# Patient Record
Sex: Male | Born: 1955 | Hispanic: Yes | Marital: Married
Health system: Southern US, Community
[De-identification: ages and names within clinical notes are randomized; demographics above are authoritative.]

## PROBLEM LIST (undated history)

## (undated) DIAGNOSIS — E785 Hyperlipidemia, unspecified: Secondary | ICD-10-CM

## (undated) DIAGNOSIS — Z8673 Personal history of transient ischemic attack (TIA), and cerebral infarction without residual deficits: Secondary | ICD-10-CM

## (undated) DIAGNOSIS — I10 Essential (primary) hypertension: Secondary | ICD-10-CM

## (undated) HISTORY — DX: Hyperlipidemia, unspecified: E78.5

## (undated) HISTORY — DX: Personal history of transient ischemic attack (TIA), and cerebral infarction without residual deficits: Z86.73

## (undated) HISTORY — DX: Essential (primary) hypertension: I10

---

## 2022-01-12 ENCOUNTER — Emergency Department (HOSPITAL_COMMUNITY)
Admission: EM | Admit: 2022-01-12 | Discharge: 2022-01-13 | Disposition: A | Discharge status: Home / Self Care | Payer: Self-pay | Attending: Psychology | Admitting: Psychology

## 2022-01-12 ENCOUNTER — Emergency Department (HOSPITAL_COMMUNITY): Payer: Self-pay

## 2022-01-12 DIAGNOSIS — R4182 Altered mental status, unspecified: Secondary | ICD-10-CM | POA: Insufficient documentation

## 2022-01-12 DIAGNOSIS — R55 Syncope and collapse: Secondary | ICD-10-CM | POA: Insufficient documentation

## 2022-01-12 DIAGNOSIS — R0789 Other chest pain: Secondary | ICD-10-CM | POA: Insufficient documentation

## 2022-01-12 DIAGNOSIS — R1110 Vomiting, unspecified: Secondary | ICD-10-CM | POA: Insufficient documentation

## 2022-01-12 DIAGNOSIS — R1013 Epigastric pain: Secondary | ICD-10-CM | POA: Insufficient documentation

## 2022-01-12 LAB — CBC WITH DIFFERENTIAL/PLATELET
Abs Immature Granulocytes: 0.04 10*3/uL (ref 0.00–0.07)
Basophils Absolute: 0 10*3/uL (ref 0.0–0.1)
Basophils Relative: 0 %
Eosinophils Absolute: 0 10*3/uL (ref 0.0–0.5)
Eosinophils Relative: 0 %
HCT: 37.7 % — ABNORMAL LOW (ref 39.0–52.0)
Hemoglobin: 13 g/dL (ref 13.0–17.0)
Immature Granulocytes: 0 %
Lymphocytes Relative: 4 %
Lymphs Abs: 0.5 10*3/uL — ABNORMAL LOW (ref 0.7–4.0)
MCH: 33 pg (ref 26.0–34.0)
MCHC: 34.5 g/dL (ref 30.0–36.0)
MCV: 95.7 fL (ref 80.0–100.0)
Monocytes Absolute: 0.6 10*3/uL (ref 0.1–1.0)
Monocytes Relative: 5 %
Neutro Abs: 10.1 10*3/uL — ABNORMAL HIGH (ref 1.7–7.7)
Neutrophils Relative %: 91 %
Platelets: 230 10*3/uL (ref 150–400)
RBC: 3.94 MIL/uL — ABNORMAL LOW (ref 4.22–5.81)
RDW: 11.5 % (ref 11.5–15.5)
WBC: 11.2 10*3/uL — ABNORMAL HIGH (ref 4.0–10.5)
nRBC: 0 % (ref 0.0–0.2)

## 2022-01-12 LAB — COMPREHENSIVE METABOLIC PANEL
ALT: 59 U/L — ABNORMAL HIGH (ref 0–44)
AST: 44 U/L — ABNORMAL HIGH (ref 15–41)
Albumin: 3.7 g/dL (ref 3.5–5.0)
Alkaline Phosphatase: 78 U/L (ref 38–126)
Anion gap: 11 (ref 5–15)
BUN: 17 mg/dL (ref 8–23)
CO2: 26 mmol/L (ref 22–32)
Calcium: 8.7 mg/dL — ABNORMAL LOW (ref 8.9–10.3)
Chloride: 100 mmol/L (ref 98–111)
Creatinine, Ser: 1.19 mg/dL (ref 0.61–1.24)
GFR, Estimated: >60 mL/min (ref >60)
Glucose, Bld: 199 mg/dL — ABNORMAL HIGH (ref 70–99)
Potassium: 3.2 mmol/L — ABNORMAL LOW (ref 3.5–5.1)
Sodium: 137 mmol/L (ref 135–145)
Total Bilirubin: 1.2 mg/dL (ref 0.3–1.2)
Total Protein: 6.6 g/dL (ref 6.5–8.1)

## 2022-01-12 LAB — CBG MONITORING, ED: Glucose-Capillary: 201 mg/dL — ABNORMAL HIGH (ref 70–99)

## 2022-01-12 LAB — TROPONIN I (HIGH SENSITIVITY): Troponin I (High Sensitivity): 10 ng/L (ref <18)

## 2022-01-12 LAB — LIPASE, BLOOD: Lipase: 25 U/L (ref 11–51)

## 2022-01-12 MED ORDER — SODIUM CHLORIDE 0.9 % IV BOLUS
1000.0000 mL | Freq: Once | INTRAVENOUS | Status: AC
  Administered 2022-01-13: 1000 mL via INTRAVENOUS

## 2022-01-12 NOTE — ED Provider Notes (Signed)
[place] EMERGENCY DEPARTMENT Provider Note   CSN: [id number] Arrival date & time: 01/12/22  2149     History  Chief Complaint  Patient presents with   Loss of Consciousness    Cory Romero is a 66 y.o. male with a hx of hypertension and prior stroke who presents to the ED via EMS S/p syncope.   Per EMS to triage RN patient with memory deficits status post prior stroke therefore history is limited.  Per patient he states that he was not feeling well therefore he told his family to call 911.  He states that his stomach did hurt.  He is unable to tell me much else, denies pain in any other locations.  Level 5 caveat applies secondary to memory deficits.  Upon patient's wife's arrival to the emergency department I discussed with her for further history.  She reports that they are traveling here from [place] to visit their son here, they had been traveling all day yesterday and did not have much to eat or drink, they landed after midnight this morning.  Tonight the patient was sitting at the dinner table eating soup when he said he did not feel well and subsequently passed out with his head falling onto the dinner table.  He was not responsive and was diaphoretic.  He did have a bowel movement during this.  She does report that he had been complaining of some abdominal pain throughout the day today in the epigastric region, had a few loose Bms today and 1 episode of emesis.  She reports that he had a stroke in December 2022 and was treated for this in [facility], he is currently on 3 months of new medicines since his discharge and has been having memory deficits since then.  Interpreter utilized throughout Sales [employer].  HPI     Home Medications Prior to Admission medications   Not on File      Allergies    Patient has no allergy information on record.    Review of Systems   Review of Systems  Unable to perform ROS: Mental status change   Physical  Exam Updated Vital Signs BP 124/76 (BP Location: Left Arm)    Pulse (!) 109    Temp 98.4 F (36.9 C)    Resp (!) 23    SpO2 97%  Physical Exam Vitals and nursing note reviewed.  Constitutional:      General: He is not in acute distress.    Appearance: He is well-developed. He is not toxic-appearing.  HENT:     Head: Normocephalic and atraumatic.     Mouth/Throat:     Mouth: Mucous membranes are dry.  Eyes:     General:        Right eye: No discharge.        Left eye: No discharge.     Conjunctiva/sclera: Conjunctivae normal.  Cardiovascular:     Rate and Rhythm: Normal rate and regular rhythm.  Pulmonary:     Effort: Pulmonary effort is normal. No respiratory distress.     Breath sounds: Normal breath sounds. No wheezing, rhonchi or rales.  Abdominal:     Palpations: Abdomen is soft.     Tenderness: There is abdominal tenderness (Mild generalized, most prominent in the epigastric region.). There is no guarding or rebound.  Genitourinary:    Comments: Digital rectal exam performed with NT at bedside to chaperone, rectal tone intact.  Musculoskeletal:     Cervical back: Neck supple.  Right lower leg: No edema.     Left lower leg: No edema.  Skin:    General: Skin is warm and dry.     Findings: No rash.  Neurological:     Mental Status: He is alert.     Comments: No significant facial droop.  Sensation grossly intact to bilateral upper and lower extremities.  5-5 symmetric grip strength and strength with plantar dorsiflexion bilaterally.  Patient lift his lower extremities off of the bed without difficulty.  Negative pronator drift.  Patient is oriented to person, disoriented to place and time.  Psychiatric:        Behavior: Behavior normal.    ED Results / Procedures / Treatments   Labs (all labs ordered are listed, but only abnormal results are displayed) Labs Reviewed  COMPREHENSIVE METABOLIC PANEL - Abnormal; Notable for the following components:      Result Value    Potassium 3.2 (*)    Glucose, Bld 199 (*)    Calcium 8.7 (*)    AST 44 (*)    ALT 59 (*)    All other components within normal limits  CBC WITH DIFFERENTIAL/PLATELET - Abnormal; Notable for the following components:   WBC 11.2 (*)    RBC 3.94 (*)    HCT 37.7 (*)    Neutro Abs 10.1 (*)    Lymphs Abs 0.5 (*)    All other components within normal limits  URINALYSIS, ROUTINE W REFLEX MICROSCOPIC - Abnormal; Notable for the following components:   Color, Urine STRAW (*)    All other components within normal limits  ACETAMINOPHEN LEVEL - Abnormal; Notable for the following components:   Acetaminophen (Tylenol), Serum <10 (*)    All other components within normal limits  SALICYLATE LEVEL - Abnormal; Notable for the following components:   Salicylate Lvl [id number] (*)    All other components within normal limits  CBG MONITORING, ED - Abnormal; Notable for the following components:   Glucose-Capillary 201 (*)    All other components within normal limits  I-STAT VENOUS BLOOD GAS, ED - Abnormal; Notable for the following components:   pH, Ven 7.467 (*)    pCO2, Ven 39.8 (*)    pO2, Ven 55.0 (*)    Bicarbonate 28.8 (*)    Acid-Base Excess 5.0 (*)    Calcium, Ion 1.13 (*)    HCT 35.0 (*)    Hemoglobin 11.9 (*)    All other components within normal limits  LIPASE, BLOOD  ETHANOL  RAPID URINE DRUG SCREEN, HOSP PERFORMED  CK  AMMONIA  LACTIC ACID, PLASMA  BLOOD GAS, VENOUS  CBG MONITORING, ED  TROPONIN I (HIGH SENSITIVITY)  TROPONIN I (HIGH SENSITIVITY)    EKG EKG Interpretation  Date/Time:  Saturday January 12 2022 21:54:26 EST Ventricular Rate:  103 PR Interval:  133 QRS Duration: 100 QT Interval:  366 QTC Calculation: 480 R Axis:   132 Text Interpretation: Right and left arm electrode reversal, interpretation assumes no reversal Sinus tachycardia Atrial premature complexes Probable left atrial enlargement Consider left ventricular hypertrophy Abnormal T, consider ischemia,  diffuse leads TWI in AVL No old tracing to compare Confirmed by Wynona Dove ([id number]) on 01/12/2022 11:29:09 PM  Radiology CT Head Wo Contrast  Result Date: 01/13/2022 CLINICAL DATA:  Altered mental status. EXAM: CT HEAD WITHOUT CONTRAST TECHNIQUE: Contiguous axial images were obtained from the base of the skull through the vertex without intravenous contrast. RADIATION DOSE REDUCTION: This exam was performed according to the departmental dose-optimization  program which includes automated exposure control, adjustment of the mA and/or kV according to patient size and/or use of iterative reconstruction technique. COMPARISON:  None. FINDINGS: Brain: Mild age-related atrophy and moderate chronic microvascular ischemic changes. There is no acute intracranial hemorrhage. No mass effect midline shift. No extra-axial fluid collection. Vascular: No hyperdense vessel or unexpected calcification. Skull: Normal. Negative for fracture or focal lesion. Sinuses/Orbits: Diffuse mucoperiosteal thickening of paranasal sinuses. Bilateral maxillary sinus retention cyst or polyp. No air-fluid level. The mastoid air cells are clear. Other: None IMPRESSION: 1. No acute intracranial pathology. 2. Age-related atrophy and chronic microvascular ischemic changes. Electronically Signed   By: Anner Crete M.D.   On: 01/13/2022 01:45   DG Chest Portable 1 View  Result Date: 01/12/2022 CLINICAL DATA:  Epigastric pain EXAM: PORTABLE CHEST 1 VIEW COMPARISON:  None. FINDINGS: Artifact overlies the chest. There is a poor inspiration. The lungs are probably clear allowing for the poor inspiration. No evidence of heart failure or effusion. IMPRESSION: Poor inspiration.  No active disease suspected. Electronically Signed   By: Nelson Chimes M.D.   On: 01/12/2022 23:07   CT Angio Chest/Abd/Pel for Dissection W and/or W/WO  Result Date: 01/13/2022 CLINICAL DATA:  Chest pain or back pain, aortic dissection suspected. Altered mental status. EXAM:  CT ANGIOGRAPHY CHEST, ABDOMEN AND PELVIS TECHNIQUE: Non-contrast CT of the chest was initially obtained. Multidetector CT imaging through the chest, abdomen and pelvis was performed using the standard protocol during bolus administration of intravenous contrast. Multiplanar reconstructed images and MIPs were obtained and reviewed to evaluate the vascular anatomy. RADIATION DOSE REDUCTION: This exam was performed according to the departmental dose-optimization program which includes automated exposure control, adjustment of the mA and/or kV according to patient size and/or use of iterative reconstruction technique. CONTRAST:  167mL OMNIPAQUE IOHEXOL 350 MG/ML SOLN COMPARISON:  None. FINDINGS: CTA CHEST FINDINGS Cardiovascular: Preferential opacification of the thoracic aorta. No evidence of thoracic aortic aneurysm or dissection. No atherosclerotic plaque of the thoracic aorta. Left anterior descending coronary artery calcifications. Normal heart size. No significant pericardial effusion. The main pulmonary artery is normal in caliber. No central or segmental pulmonary embolus. Mediastinum/Nodes: No enlarged mediastinal, hilar, or axillary lymph nodes. Thyroid gland, trachea, and esophagus demonstrate no significant findings. Lungs/Pleura: Bilateral lower lobe subsegmental atelectasis. No focal consolidation. No pulmonary nodule. No pulmonary mass. No pleural effusion. No pneumothorax. Musculoskeletal: No chest wall abnormality. No suspicious lytic or blastic osseous lesions. No acute displaced fracture. Multilevel degenerative changes of the spine. Review of the MIP images confirms the above findings. CTA ABDOMEN AND PELVIS FINDINGS VASCULAR Aorta: Mild atherosclerotic plaque. Normal caliber aorta without aneurysm, dissection, vasculitis or significant stenosis. Celiac: Patent without evidence of aneurysm, dissection, vasculitis or significant stenosis. SMA: Patent without evidence of aneurysm, dissection,  vasculitis or significant stenosis. Renals: Both renal arteries are patent without evidence of aneurysm, dissection, vasculitis, fibromuscular dysplasia or significant stenosis. IMA: Patent without evidence of aneurysm, dissection, vasculitis or significant stenosis. Inflow: Mild atherosclerotic plaque. Patent without evidence of aneurysm, dissection, vasculitis or significant stenosis. Veins: No obvious venous abnormality within the limitations of this arterial phase study. Review of the MIP images confirms the above findings. NON-VASCULAR Hepatobiliary: No focal liver abnormality. No gallstones, gallbladder wall thickening, or pericholecystic fluid. No biliary dilatation. Pancreas: No focal lesion. Normal pancreatic contour. No surrounding inflammatory changes. No main pancreatic ductal dilatation. Spleen: Normal in size without focal abnormality. Adrenals/Urinary Tract: No adrenal nodule bilaterally. Bilateral kidneys enhance symmetrically. No hydronephrosis. No hydroureter.  The urinary bladder is unremarkable. Stomach/Bowel: Stomach is within normal limits. No evidence of bowel wall thickening or dilatation. Appendix appears normal. Lymphatic: No lymphadenopathy. Reproductive: Prostate is unremarkable. Other: No intraperitoneal free fluid. No intraperitoneal free gas. No organized fluid collection. Musculoskeletal: No abdominal wall hernia or abnormality. Mild subcutaneus soft tissue edema of the left it all soft tissue/hip with no large hematoma formation. No suspicious lytic or blastic osseous lesions. No acute displaced fracture. Multilevel degenerative changes of the spine. Review of the MIP images confirms the above findings. IMPRESSION: 1. No acute aortic abnormality. Aortic Atherosclerosis (ICD10-I70.0) including left anterior descending coronary artery calcification. 2. No pulmonary embolus. 3. No acute intrathoracic, intra-abdominal, intrapelvic abnormality. Electronically Signed   By: Iven Finn  M.D.   On: 01/13/2022 01:51    Procedures Procedures    Medications Ordered in ED Medications - No data to display  ED Course/ Medical Decision Making/ A&P                           Medical Decision Making Amount and/or Complexity of Data Reviewed Labs: ordered. Radiology: ordered.  Risk Prescription drug management.  Patient presents to the ED for evaluation of syncope, this involves an extensive number of treatment options, and is a complaint that carries with it a high risk of complications and morbidity. Nontoxic, vitals w/ mild tachycardia which improved on exam and while in the ED. Patient confused, no focal neuro deficits on exam. Some abdominal TTP present. MM dry.  Plan for broad work-up, will give fluids and Pepcid for symptomatic management.  Additional history obtained:  Additional history obtained from EMS, patient's wife.   EKG:  TWI in AVL, no STEMI, no prior to compare to   Cardiac Monitoring:  The patient was maintained on a cardiac monitor.  I personally viewed and interpreted the cardiac monitored which showed an underlying rhythm of: sinus rhythm.   Lab Tests:  I Ordered, reviewed, and interpreted labs, pertinent results include:  CBC: mild leukocytosis felt to be nonspecific.  CMP: mlidly elevated LFTs, hyperglycemia, and hypokalemia Lipase: WNL Etoh, acetaminophen, salicylate: Negative UDS: Negative Trops: Flat Lactic: WNL UA: no UTI Ammonia: Within normal limits UDS: Negative.   Imaging Studies ordered:  I ordered imaging studies which included CT head,  I independently reviewed & interpreted imaging & am in agreement with radiology impression which shows:  Chest x-ray: Poor inspiration.  No active disease suspected. CT head:1. No acute intracranial pathology. 2. Age-related atrophy and chronic microvascular ischemic changes.  CT angio chest/abdomen/pelvis dissection study:1. No acute aortic abnormality. Aortic Atherosclerosis (ICD10-I70.0)  including left anterior descending coronary artery calcification. 2. No pulmonary embolus. 3. No acute intrathoracic, intra-abdominal,   ED Course:  Patient presented to the emergency department status post syncopal episode, has had some decreased p.o. intake as well as some episodes of diarrhea today.  He has been given 1 L of normal saline, he is tolerating p.o., he is ambulatory in the emergency department.  His work-ups been overall reassuring, no critical electrolyte derangement, mild hypokalemia was orally replaced.  Mild elevated LFTs, will need PCP recheck.  Troponins are flat.  Lactic acid is within normal limits.  Toxicology unremarkable.  CT head as well as CT angio of the chest/abdomen/pelvis are fairly unremarkable for acute process.  Repeat abdominal exam remains without peritoneal signs, do not suspect surgical abdomen.  Cardiac monitoring fairly unremarkable.  Nursing staff has documented tachypneic respiratory rates at times,  however suspect this is monitor error as on multiple re-assessment he has not been tachypneic. Unclear definitive etiology of his syncope, suspect patient may have been dehydrated and had vasovagal episode with bowel movement during syncopal episode.  Given clinical improvement, his wife reports he is at baseline, and his ability to ambulate and tolerate p.o. feel he is reasonable for discharge.  Feel he would benefit from cardiology and primary care follow-up. I discussed results, treatment plan, need for follow-up, and return precautions with the patient and wife @ bedside- in agreement.   Findings and plan of care discussed with supervising physician Dr. Dr. Pearline Cables who has evaluated patient as shared visit & is in agreement.   Portions of this note were generated with Lobbyist. Dictation errors may occur despite best attempts at proofreading.   Final Clinical Impression(s) / ED Diagnoses Final diagnoses:  Syncope, unspecified syncope type    Rx  / DC Orders ED Discharge Orders     None         Amaryllis Dyke, PA-C 01/13/22 [employer], [place], DO 01/13/22 [phone]

## 2022-01-12 NOTE — ED Triage Notes (Signed)
Pt BIB [facility] from home for syncopal episode.  Pt and family are spanish speaking and require interpreter.  EMS used 911 interpreter to determine what they could.  Pt has been complaining of epigastric pain and had an episode of incontinence this afternoon.  Then this evening he had the syncopal episode with another episode of incontinence.  EMS stated his abdomen was tender to touch on palpation and that he seemed jaundiced to them.  PT had a stroke a few years ago and has memory issues as a result.  Family makes medical decisions now.   EMS Vitals:  130/82, HR 98, 02: 97 on 3L. EMS states he was 90-91% on RA and does not wear 02 at baseline that they could determine.

## 2022-01-13 ENCOUNTER — Emergency Department (HOSPITAL_COMMUNITY): Payer: Self-pay

## 2022-01-13 LAB — RAPID URINE DRUG SCREEN, HOSP PERFORMED
Amphetamines: NOT DETECTED
Barbiturates: NOT DETECTED
Benzodiazepines: NOT DETECTED
Cocaine: NOT DETECTED
Opiates: NOT DETECTED
Tetrahydrocannabinol: NOT DETECTED

## 2022-01-13 LAB — I-STAT VENOUS BLOOD GAS, ED
Acid-Base Excess: 5 mmol/L — ABNORMAL HIGH (ref 0.0–2.0)
Bicarbonate: 28.8 mmol/L — ABNORMAL HIGH (ref 20.0–28.0)
Calcium, Ion: 1.13 mmol/L — ABNORMAL LOW (ref 1.15–1.40)
HCT: 35 % — ABNORMAL LOW (ref 39.0–52.0)
Hemoglobin: 11.9 g/dL — ABNORMAL LOW (ref 13.0–17.0)
O2 Saturation: 90 %
Potassium: 3.6 mmol/L (ref 3.5–5.1)
Sodium: 139 mmol/L (ref 135–145)
TCO2: 30 mmol/L (ref 22–32)
pCO2, Ven: 39.8 mmHg — ABNORMAL LOW (ref 44.0–60.0)
pH, Ven: 7.467 — ABNORMAL HIGH (ref 7.250–7.430)
pO2, Ven: 55 mmHg — ABNORMAL HIGH (ref 32.0–45.0)

## 2022-01-13 LAB — URINALYSIS, ROUTINE W REFLEX MICROSCOPIC
Bilirubin Urine: NEGATIVE
Glucose, UA: NEGATIVE mg/dL
Hgb urine dipstick: NEGATIVE
Ketones, ur: NEGATIVE mg/dL
Leukocytes,Ua: NEGATIVE
Nitrite: NEGATIVE
Protein, ur: NEGATIVE mg/dL
Specific Gravity, Urine: 1.03 (ref 1.005–1.030)
pH: 7 (ref 5.0–8.0)

## 2022-01-13 LAB — LACTIC ACID, PLASMA: Lactic Acid, Venous: 1.7 mmol/L (ref 0.5–1.9)

## 2022-01-13 LAB — TROPONIN I (HIGH SENSITIVITY): Troponin I (High Sensitivity): 9 ng/L (ref <18)

## 2022-01-13 LAB — ETHANOL: Alcohol, Ethyl (B): <10 mg/dL (ref <10)

## 2022-01-13 LAB — CK: Total CK: 132 U/L (ref 49–397)

## 2022-01-13 LAB — ACETAMINOPHEN LEVEL: Acetaminophen (Tylenol), Serum: <10 ug/mL — ABNORMAL LOW (ref 10–30)

## 2022-01-13 LAB — AMMONIA: Ammonia: 25 umol/L (ref 9–35)

## 2022-01-13 LAB — SALICYLATE LEVEL: Salicylate Lvl: <7 mg/dL — ABNORMAL LOW (ref 7.0–30.0)

## 2022-01-13 MED ORDER — IOHEXOL 350 MG/ML SOLN
100.0000 mL | Freq: Once | INTRAVENOUS | Status: AC | PRN
  Administered 2022-01-13: 100 mL via INTRAVENOUS

## 2022-01-13 MED ORDER — FAMOTIDINE IN NACL 20-0.9 MG/50ML-% IV SOLN
20.0000 mg | Freq: Once | INTRAVENOUS | Status: AC
Start: 2022-01-13 — End: 2022-01-13
  Administered 2022-01-13: 20 mg via INTRAVENOUS
  Filled 2022-01-13: qty 50

## 2022-01-13 MED ORDER — POTASSIUM CHLORIDE CRYS ER 20 MEQ PO TBCR
40.0000 meq | EXTENDED_RELEASE_TABLET | Freq: Once | ORAL | Status: DC
Start: 2022-01-13 — End: 2022-01-13

## 2022-01-13 NOTE — ED Notes (Signed)
Pt  safely ambulated around room w/ no assistance and tolerated PO intake

## 2022-01-13 NOTE — Discharge Instructions (Signed)
You were seen in the ER today after passing out.   Your blood work was overall reassuring, it did show that your liver function tests are mildly elevated, your blood sugar was mildly elevated, your potassium was mildly low.  Please see attached guidelines to include potassium rich foods in your diet.  Please be sure to stay well-hydrated as we think this may be why you passed out.  Follow-up with primary care as well as cardiology soon as possible.  Return to the emergency department for new or worsening symptoms or any other concerns.  English to [place] Google Translate: Te vieron en urgencias hoy despus de desmayarte.  Su anlisis de [employer] tranquilizador en general, mostr que sus pruebas de funcin heptica estn ligeramente elevadas, su nivel de azcar en la sangre estaba ligeramente elevado, su potasio estaba ligeramente bajo. Consulte las pautas adjuntas para incluir alimentos ricos en potasio en su dieta. Asegrese de [employer] hidratado, ya que creemos que esta puede ser la razn por la que se [place]. Seguimiento con atencin primaria y cardiologa tan pronto como sea posible. Regrese al departamento de emergencias si hay sntomas nuevos o que empeoran o cualquier otra inquietud.

## 2023-01-19 IMAGING — DX DG CHEST 1V PORT
1 series · 1 of 1 positions shown · non-contrast
Comparison: None.

CLINICAL DATA: Epigastric pain

EXAM:
PORTABLE CHEST 1 VIEW

[chest ap]
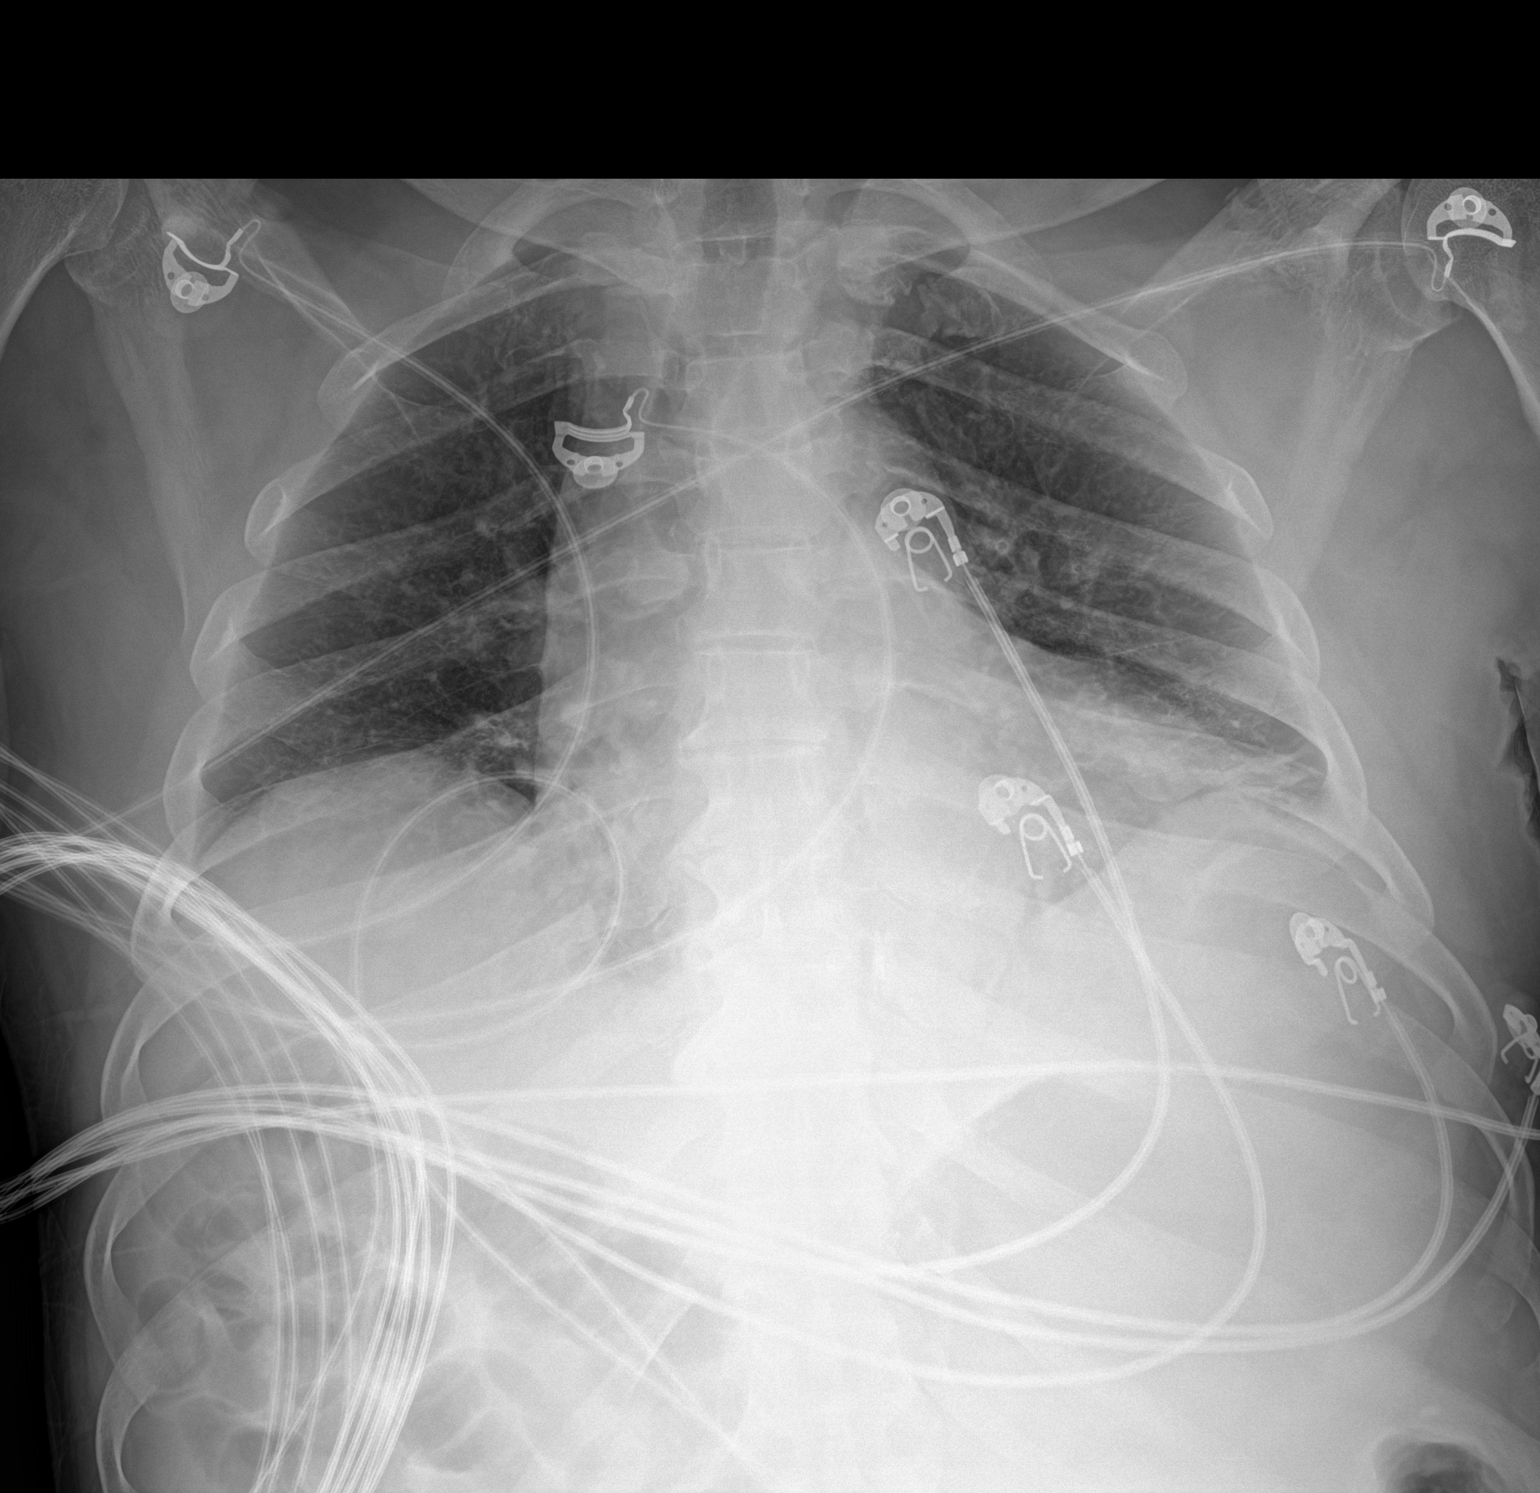

[1 of 1 positions shown; findings below may reference images not displayed]

FINDINGS: Artifact overlies the chest. There is a poor inspiration. The lungs
are probably clear allowing for the poor inspiration. No evidence of
heart failure or effusion.
IMPRESSION: Poor inspiration.  No active disease suspected.

## 2023-09-22 ENCOUNTER — Ambulatory Visit: Payer: 59 | Admitting: Medical

## 2023-09-24 ENCOUNTER — Ambulatory Visit (INDEPENDENT_AMBULATORY_CARE_PROVIDER_SITE_OTHER): Payer: 59 | Admitting: Medical

## 2023-09-24 ENCOUNTER — Encounter: Payer: Self-pay | Admitting: Medical

## 2023-09-24 ENCOUNTER — Other Ambulatory Visit (HOSPITAL_BASED_OUTPATIENT_CLINIC_OR_DEPARTMENT_OTHER): Payer: Self-pay

## 2023-09-24 VITALS — BP 152/90 | HR 75 | Temp 98.1°F | Resp 18 | Ht 66.0 in | Wt 150.6 lb

## 2023-09-24 DIAGNOSIS — I10 Essential (primary) hypertension: Secondary | ICD-10-CM

## 2023-09-24 DIAGNOSIS — R413 Other amnesia: Secondary | ICD-10-CM | POA: Diagnosis not present

## 2023-09-24 DIAGNOSIS — R4781 Slurred speech: Secondary | ICD-10-CM

## 2023-09-24 DIAGNOSIS — E785 Hyperlipidemia, unspecified: Secondary | ICD-10-CM | POA: Diagnosis not present

## 2023-09-24 DIAGNOSIS — R350 Frequency of micturition: Secondary | ICD-10-CM

## 2023-09-24 DIAGNOSIS — R972 Elevated prostate specific antigen [PSA]: Secondary | ICD-10-CM

## 2023-09-24 DIAGNOSIS — Z8673 Personal history of transient ischemic attack (TIA), and cerebral infarction without residual deficits: Secondary | ICD-10-CM | POA: Diagnosis not present

## 2023-09-24 LAB — CBC WITH DIFFERENTIAL/PLATELET
Basophils Absolute: 0.1 10*3/uL (ref 0.0–0.1)
Basophils Relative: 0.9 % (ref 0.0–3.0)
Eosinophils Absolute: 0.1 10*3/uL (ref 0.0–0.7)
Eosinophils Relative: 1.8 % (ref 0.0–5.0)
HCT: 42.8 % (ref 39.0–52.0)
Hemoglobin: 13.9 g/dL (ref 13.0–17.0)
Lymphocytes Relative: 20.9 % (ref 12.0–46.0)
Lymphs Abs: 1.3 10*3/uL (ref 0.7–4.0)
MCHC: 32.5 g/dL (ref 30.0–36.0)
MCV: 97.3 fL (ref 78.0–100.0)
Monocytes Absolute: 0.5 10*3/uL (ref 0.1–1.0)
Monocytes Relative: 8 % (ref 3.0–12.0)
Neutro Abs: 4.3 10*3/uL (ref 1.4–7.7)
Neutrophils Relative %: 68.4 % (ref 43.0–77.0)
Platelets: 281 10*3/uL (ref 150.0–400.0)
RBC: 4.4 Mil/uL (ref 4.22–5.81)
RDW: 12.3 % (ref 11.5–15.5)
WBC: 6.3 10*3/uL (ref 4.0–10.5)

## 2023-09-24 LAB — COMPREHENSIVE METABOLIC PANEL
ALT: 11 U/L (ref 0–53)
AST: 14 U/L (ref 0–37)
Albumin: 4.6 g/dL (ref 3.5–5.2)
Alkaline Phosphatase: 65 U/L (ref 39–117)
BUN: 13 mg/dL (ref 6–23)
CO2: 29 meq/L (ref 19–32)
Calcium: 9.9 mg/dL (ref 8.4–10.5)
Chloride: 101 meq/L (ref 96–112)
Creatinine, Ser: 1.03 mg/dL (ref 0.40–1.50)
GFR: 76.12 mL/min (ref >60.00)
Glucose, Bld: 111 mg/dL — ABNORMAL HIGH (ref 70–99)
Potassium: 4.4 meq/L (ref 3.5–5.1)
Sodium: 137 meq/L (ref 135–145)
Total Bilirubin: 1.4 mg/dL — ABNORMAL HIGH (ref 0.2–1.2)
Total Protein: 7.4 g/dL (ref 6.0–8.3)

## 2023-09-24 LAB — LIPID PANEL
Cholesterol: 219 mg/dL — ABNORMAL HIGH (ref 0–200)
HDL: 60.2 mg/dL (ref >39.00)
LDL Cholesterol: 140 mg/dL — ABNORMAL HIGH (ref 0–99)
NonHDL: 158.71
Total CHOL/HDL Ratio: 4
Triglycerides: 96 mg/dL (ref 0.0–149.0)
VLDL: 19.2 mg/dL (ref 0.0–40.0)

## 2023-09-24 LAB — POC URINALSYSI DIPSTICK (AUTOMATED)
Bilirubin, UA: NEGATIVE
Blood, UA: NEGATIVE
Glucose, UA: NEGATIVE
Ketones, UA: NEGATIVE
Leukocytes, UA: NEGATIVE
Nitrite, UA: NEGATIVE
Protein, UA: NEGATIVE
Spec Grav, UA: 1.01 (ref 1.010–1.025)
Urobilinogen, UA: 0.2 U/dL
pH, UA: 5 (ref 5.0–8.0)

## 2023-09-24 LAB — PSA: PSA: 5.79 ng/mL — ABNORMAL HIGH (ref 0.10–4.00)

## 2023-09-24 LAB — VITAMIN B12: Vitamin B-12: 289 pg/mL (ref 211–911)

## 2023-09-24 MED ORDER — LOSARTAN POTASSIUM 50 MG PO TABS
50.0000 mg | ORAL_TABLET | Freq: Every day | ORAL | 11 refills | Status: AC
  Filled 2023-09-24: qty 30, 30d supply, fill #0
  Filled 2023-11-17 (×2): qty 30, 30d supply, fill #1
  Filled 2023-12-11: qty 30, 30d supply, fill #2
  Filled 2024-01-19 – 2024-01-20 (×2): qty 30, 30d supply, fill #3
  Filled 2024-03-12: qty 30, 30d supply, fill #4
  Filled 2024-06-11: qty 30, 30d supply, fill #5
  Filled 2024-09-07: qty 30, 30d supply, fill #6

## 2023-09-24 MED ORDER — AMLODIPINE BESYLATE 5 MG PO TABS
5.0000 mg | ORAL_TABLET | Freq: Every day | ORAL | 11 refills | Status: AC
  Filled 2023-09-24: qty 30, 30d supply, fill #0
  Filled 2023-11-17: qty 30, 30d supply, fill #1
  Filled 2023-12-11: qty 30, 30d supply, fill #2
  Filled 2024-01-19 – 2024-01-20 (×2): qty 30, 30d supply, fill #3
  Filled 2024-03-12: qty 30, 30d supply, fill #4
  Filled 2024-06-11: qty 30, 30d supply, fill #5
  Filled 2024-09-07: qty 30, 30d supply, fill #6

## 2023-09-24 MED ORDER — ROSUVASTATIN CALCIUM 40 MG PO TABS
40.0000 mg | ORAL_TABLET | Freq: Every day | ORAL | 11 refills | Status: AC
  Filled 2023-09-24: qty 30, 30d supply, fill #0
  Filled 2023-11-17: qty 30, 30d supply, fill #1
  Filled 2023-12-11: qty 30, 30d supply, fill #2
  Filled 2024-01-19 – 2024-01-20 (×2): qty 30, 30d supply, fill #3
  Filled 2024-03-12: qty 30, 30d supply, fill #4
  Filled 2024-06-11: qty 30, 30d supply, fill #5
  Filled 2024-09-07: qty 30, 30d supply, fill #6

## 2023-09-24 NOTE — Patient Instructions (Signed)
1. Hypertension, unspecified type Losartan 50 mg daily and amlodipine 50 mg daily - Comp Met (CMET)  2. History of CVA in adulthood - Comp Met (CMET) - CBC w/Diff - CT HEAD WO CONTRAST ( ); Future  3. Hyperlipidemia, unspecified hyperlipidemia type Crestor 40 mg daily - Comp Met (CMET) - Lipid panel  4. Memory deficits Severe low mmse 7. Over past year difficulty speaking - B12 - CT HEAD WO CONTRAST ( ); Future  5. Frequent urination  - POCT Urinalysis Dipstick (Automated) - PSA  6. Slurred speech Recently worse but some poor speech that started in past. First time meeting pt and speech issue obvious. - CT HEAD WO CONTRAST ( ); Future  -need to get prior auth. If other symptoms/sign occure other than current speech issue then be seen in ED.  Follow up in 2 weeks or sooner if needed.

## 2023-09-24 NOTE — Progress Notes (Signed)
Subjective:    Patient ID: Cory Romero, male    DOB: Aug 22, 1956, 67 y.o.   MRN: [id number]  HPI Pt in for first time.  Pt is from [place].  Pt has history of CVA  December 2022 and carotid artery disease(dx in [place]). Pt has memory issues and told would eventually have dementia.  Wife state over past year he has lost ability to speak.   Htn- pt had been on medication but recently stopped taking/ran out of meds.  Post stroke in past was given candasartan 16 mg and amlodpine 5 mg daily. Plavix 75 mg daily and rosuvastatin 40 mg daily.   Per wife told to ue plavix 75 mg daily for 3 months has not used for 2 years.   Pt had syncope 01-12-2022. Work up in ED   Imaging Studies ordered:  I ordered imaging studies which included CT head,  I independently reviewed & interpreted imaging & am in agreement with radiology impression which shows:  Chest x-ray: Poor inspiration.  No active disease suspected. CT head:1. No acute intracranial pathology. 2. Age-related atrophy and chronic microvascular ischemic changes.  CT angio chest/abdomen/pelvis dissection study:1. No acute aortic abnormality. Aortic Atherosclerosis (ICD10-I70.0) including left anterior descending coronary artery calcification. 2. No pulmonary embolus. 3. No acute intrathoracic, intra-abdominal,   Over past 2 years memory worsening and has hard time expresing himself.     Review of Systems  Respiratory:  Negative for cough, chest tightness, wheezing and stridor.   Cardiovascular:  Negative for chest pain and palpitations.  Gastrointestinal:  Negative for abdominal pain, constipation, nausea and vomiting.  Genitourinary:  Positive for frequency. Negative for dysuria, penile swelling and urgency.       For 2 years urinating frequently day and night.  Musculoskeletal:  Negative for gait problem.  Skin:  Negative for rash.  Neurological:  Negative for dizziness and light-headedness.  Hematological:  Negative for  adenopathy. Does not bruise/bleed easily.  Psychiatric/Behavioral:  Negative for behavioral problems.        Over 2 years severe memory changes per wife. Slurred speach    Past Medical History:  Diagnosis Date   History of stroke    Hyperlipidemia    Hypertension      Social History   Socioeconomic History   Marital status: Married    Spouse name: Not on file   Number of children: Not on file   Years of education: Not on file   Highest education level: Not on file  Occupational History   Occupation: work in hotel maintenance  Tobacco Use   Smoking status: Former    Current packs/day: 0.00    Average packs/day: 1.5 packs/day for 41.0 years (61.5 ttl pk-yrs)    Types: Cigarettes    Start date: 20    Quit date: 2018    Years since quitting: 6.8   Smokeless tobacco: Never  Vaping Use   Vaping status: Never Used  Substance and Sexual Activity   Alcohol use: Not Currently    Comment: none for 6 years   Drug use: Never   Sexual activity: Not Currently  Other Topics Concern   Not on file  Social History Narrative   Not on file   Social Determinants of Health   Financial Resource Strain: Not on file  Food Insecurity: Not on file  Transportation Needs: Not on file  Physical Activity: Not on file  Stress: Not on file  Social Connections: Not on file  Intimate Partner Violence: Not  on file    History reviewed. No pertinent surgical history.  History reviewed. No pertinent family history.  Not on File  No current outpatient medications on file prior to visit.   No current facility-administered medications on file prior to visit.    BP (!) 152/90   Pulse 75   Temp 98.1 F (36.7 C) (Oral)   Resp 18   Ht 5\' 6"  (1.676 m)   Wt 150 lb 9.6 oz (68.3 kg)   SpO2 95%   BMI 24.31 kg/m        Objective:   Physical Exam  General Mental Status- Alert. General Appearance- Not in acute distress.   Skin General: Color- Normal Color. Moisture- Normal  Moisture.  Neck Carotid Arteries- Normal color. Moisture- Normal Moisture. No carotid bruits. No JVD.  Chest and Lung Exam Auscultation: Breath Sounds:-Normal.  Cardiovascular Auscultation:Rythm- Regular. Murmurs & Other Heart Sounds:Auscultation of the heart reveals- No Murmurs.  Abdomen Inspection:-Inspeection Normal. Palpation/Percussion:Note:No mass. Palpation and Percussion of the abdomen reveal- Non Tender, Non Distended + BS, no rebound or guarding.    Neurologic Cranial Nerve exam:- CN III-XII intact(No nystagmus), symmetric smile. Drift Test:- No drift. Romberg Exam:- Negative.  Heal to Toe Gait exam:-Normal. Finger to Nose:- Normal/Intact Strength:- 5/5 equal and symmetric strength both upper and lower extremities.       Assessment & Plan:   Patient Instructions  1. Hypertension, unspecified type Losartan 50 mg daily and amlodipine 50 mg daily - Comp Met (CMET)  2. History of CVA in adulthood - Comp Met (CMET) - CBC w/Diff - CT HEAD WO CONTRAST ( ); Future  3. Hyperlipidemia, unspecified hyperlipidemia type Crestor 40 mg daily - Comp Met (CMET) - Lipid panel  4. Memory deficits Severe low mmse 7. Over past year difficulty speaking - B12 - CT HEAD WO CONTRAST ( ); Future  5. Frequent urination  - POCT Urinalysis Dipstick (Automated) - PSA  6. Slurred speech Recently worse but some poor speech that started in past. First time meeting pt and speech issue obvious. - CT HEAD WO CONTRAST ( ); Future  -need to get prior auth. If other symptoms/sign occure other than current speech issue then be seen in ED.  Follow up in 2 weeks or sooner if needed.   [name], PA-C  Time spent with patient today was 61  minutes new pt with complicated history and no records) performed mmse today, chart review, discussing diagnosis, work up treatment and documentation. Placed referral neurologist. Answered question of wife.

## 2023-09-25 NOTE — Addendum Note (Signed)
Addended by: Gwenevere Abbot on: 09/25/2023 05:37 PM   Modules accepted: Orders

## 2023-09-29 ENCOUNTER — Telehealth: Payer: Self-pay | Admitting: Medical

## 2023-09-29 NOTE — Telephone Encounter (Signed)
Called patient back several times but no answer

## 2023-09-29 NOTE — Telephone Encounter (Signed)
Patient spouse called and would like a call back for lab results. Patient Is spanish speaking. Please call [phone]

## 2023-09-30 NOTE — Telephone Encounter (Signed)
Called again and still no answer. Results mailed out to patient

## 2023-10-08 ENCOUNTER — Ambulatory Visit: Payer: Self-pay | Admitting: Medical

## 2023-10-15 ENCOUNTER — Other Ambulatory Visit: Payer: 59

## 2023-10-20 ENCOUNTER — Ambulatory Visit: Payer: 59 | Admitting: Medical

## 2023-10-29 ENCOUNTER — Ambulatory Visit
Admission: RE | Admit: 2023-10-29 | Discharge: 2023-10-29 | Disposition: A | Discharge status: Home / Self Care | Payer: 59 | Source: Ambulatory Visit | Attending: Medical | Admitting: Medical

## 2023-10-29 ENCOUNTER — Ambulatory Visit: Payer: 59 | Admitting: Urology

## 2023-10-29 DIAGNOSIS — R4182 Altered mental status, unspecified: Secondary | ICD-10-CM | POA: Diagnosis not present

## 2023-10-29 DIAGNOSIS — Z8673 Personal history of transient ischemic attack (TIA), and cerebral infarction without residual deficits: Secondary | ICD-10-CM

## 2023-10-29 DIAGNOSIS — R4781 Slurred speech: Secondary | ICD-10-CM

## 2023-10-29 DIAGNOSIS — R413 Other amnesia: Secondary | ICD-10-CM

## 2023-10-29 DIAGNOSIS — G319 Degenerative disease of nervous system, unspecified: Secondary | ICD-10-CM | POA: Diagnosis not present

## 2023-10-29 DIAGNOSIS — I6389 Other cerebral infarction: Secondary | ICD-10-CM | POA: Diagnosis not present

## 2023-10-29 NOTE — Progress Notes (Deleted)
   Assessment: 1. Elevated PSA     Plan: I personally reviewed the patient's chart including provider notes, and lab results. Today I had a long discussion with the patient regarding PSA and the rationale and controversies of prostate cancer early detection.  I discussed the pros and cons of further evaluation including TRUS and prostate Bx.  Potential adverse events and complications as well as standard instructions were given.  Patient expressed his understanding of these issues.    Chief Complaint: No chief complaint on file.   History of Present Illness:  Cory Romero is a 67 y.o. male who is seen in consultation from Cory Romero, [place] for evaluation of elevated PSA. PSA 10/24: 5.79   Past Medical History:  Past Medical History:  Diagnosis Date   History of stroke    Hyperlipidemia    Hypertension     Past Surgical History:  No past surgical history on file.  Allergies:  Not on File  Family History:  No family history on file.  Social History:  Social History   Tobacco Use   Smoking status: Former    Current packs/day: 0.00    Average packs/day: 1.5 packs/day for 41.0 years (61.5 ttl pk-yrs)    Types: Cigarettes    Start date: 71    Quit date: 2018    Years since quitting: 6.9   Smokeless tobacco: Never  Vaping Use   Vaping status: Never Used  Substance Use Topics   Alcohol use: Not Currently    Comment: none for 6 years   Drug use: Never    Review of symptoms:  Constitutional:  Negative for unexplained weight loss, night sweats, fever, chills ENT:  Negative for nose bleeds, sinus pain, painful swallowing CV:  Negative for chest pain, shortness of breath, exercise intolerance, palpitations, loss of consciousness Resp:  Negative for cough, wheezing, shortness of breath GI:  Negative for nausea, vomiting, diarrhea, bloody stools GU:  Positives noted in HPI; otherwise negative for gross hematuria, dysuria, urinary incontinence Neuro:   Negative for seizures, poor balance, limb weakness, slurred speech Psych:  Negative for lack of energy, depression, anxiety Endocrine:  Negative for polydipsia, polyuria, symptoms of hypoglycemia (dizziness, hunger, sweating) Hematologic:  Negative for anemia, purpura, petechia, prolonged or excessive bleeding, use of anticoagulants  Allergic:  Negative for difficulty breathing or choking as a result of exposure to anything; no shellfish allergy; no allergic response (rash/itch) to materials, foods  Physical exam: There were no vitals taken for this visit. GENERAL APPEARANCE:  Well appearing, well developed, well nourished, NAD HEENT: Atraumatic, Normocephalic, oropharynx clear. NECK: Supple without lymphadenopathy or thyromegaly. LUNGS: Clear to auscultation bilaterally. HEART: Regular Rate and Rhythm without murmurs, gallops, or rubs. ABDOMEN: Soft, non-tender, No Masses. EXTREMITIES: Moves all extremities well.  Without clubbing, cyanosis, or edema. NEUROLOGIC:  Alert and oriented x 3, normal gait, CN II-XII grossly intact.  MENTAL STATUS:  Appropriate. BACK:  Non-tender to palpation.  No CVAT SKIN:  Warm, dry and intact.   GU: Penis:  {Exam; penis:5791} Meatus: {Meatus:15530} Scrotum: {pe scrotum:310183} Testis: {Exam; testicles:5790} Epididymis: {epididymis ZDGU:440347} Prostate: {Exam; prostate:5793} Rectum: {rectal exam:26517}   Results: U/A:

## 2023-10-30 ENCOUNTER — Telehealth: Payer: Self-pay | Admitting: Medical

## 2023-10-30 NOTE — Telephone Encounter (Signed)
Gwenn,  I placed referral about a month ago. On review neurologist scheduling note stated "Once notes are done, send back urgent referral". Note was done same day as referral. Just got back CT head report. Will you reach out to neurologist office and ask if they can expidite his referral(if possibe?).   Thanks, [name], PA-C

## 2023-11-05 ENCOUNTER — Other Ambulatory Visit: Payer: 59

## 2023-11-12 ENCOUNTER — Encounter (HOSPITAL_BASED_OUTPATIENT_CLINIC_OR_DEPARTMENT_OTHER): Payer: Self-pay | Admitting: Urology

## 2023-11-12 ENCOUNTER — Emergency Department (HOSPITAL_BASED_OUTPATIENT_CLINIC_OR_DEPARTMENT_OTHER): Payer: 59

## 2023-11-12 ENCOUNTER — Observation Stay (HOSPITAL_BASED_OUTPATIENT_CLINIC_OR_DEPARTMENT_OTHER)
Admission: EM | Admit: 2023-11-12 | Discharge: 2023-11-14 | Disposition: A | Discharge status: Home / Self Care | Payer: 59 | Attending: Emergency Medicine | Admitting: Emergency Medicine

## 2023-11-12 ENCOUNTER — Other Ambulatory Visit: Payer: Self-pay

## 2023-11-12 DIAGNOSIS — I422 Other hypertrophic cardiomyopathy: Secondary | ICD-10-CM | POA: Insufficient documentation

## 2023-11-12 DIAGNOSIS — R739 Hyperglycemia, unspecified: Secondary | ICD-10-CM | POA: Diagnosis not present

## 2023-11-12 DIAGNOSIS — I6782 Cerebral ischemia: Secondary | ICD-10-CM | POA: Diagnosis not present

## 2023-11-12 DIAGNOSIS — Z7902 Long term (current) use of antithrombotics/antiplatelets: Secondary | ICD-10-CM | POA: Insufficient documentation

## 2023-11-12 DIAGNOSIS — I63532 Cerebral infarction due to unspecified occlusion or stenosis of left posterior cerebral artery: Secondary | ICD-10-CM | POA: Diagnosis not present

## 2023-11-12 DIAGNOSIS — Z87891 Personal history of nicotine dependence: Secondary | ICD-10-CM | POA: Diagnosis not present

## 2023-11-12 DIAGNOSIS — R4189 Other symptoms and signs involving cognitive functions and awareness: Secondary | ICD-10-CM | POA: Insufficient documentation

## 2023-11-12 DIAGNOSIS — Z79899 Other long term (current) drug therapy: Secondary | ICD-10-CM | POA: Insufficient documentation

## 2023-11-12 DIAGNOSIS — D649 Anemia, unspecified: Secondary | ICD-10-CM | POA: Insufficient documentation

## 2023-11-12 DIAGNOSIS — G9389 Other specified disorders of brain: Secondary | ICD-10-CM | POA: Diagnosis not present

## 2023-11-12 DIAGNOSIS — R29818 Other symptoms and signs involving the nervous system: Secondary | ICD-10-CM | POA: Diagnosis not present

## 2023-11-12 DIAGNOSIS — I639 Cerebral infarction, unspecified: Principal | ICD-10-CM | POA: Diagnosis present

## 2023-11-12 DIAGNOSIS — R519 Headache, unspecified: Secondary | ICD-10-CM | POA: Diagnosis not present

## 2023-11-12 DIAGNOSIS — Z7982 Long term (current) use of aspirin: Secondary | ICD-10-CM | POA: Insufficient documentation

## 2023-11-12 DIAGNOSIS — I10 Essential (primary) hypertension: Secondary | ICD-10-CM | POA: Diagnosis not present

## 2023-11-12 DIAGNOSIS — R410 Disorientation, unspecified: Secondary | ICD-10-CM | POA: Diagnosis not present

## 2023-11-12 DIAGNOSIS — E785 Hyperlipidemia, unspecified: Secondary | ICD-10-CM | POA: Diagnosis not present

## 2023-11-12 LAB — URINALYSIS, ROUTINE W REFLEX MICROSCOPIC
Bilirubin Urine: NEGATIVE
Glucose, UA: NEGATIVE mg/dL
Hgb urine dipstick: NEGATIVE
Ketones, ur: NEGATIVE mg/dL
Leukocytes,Ua: NEGATIVE
Nitrite: NEGATIVE
Protein, ur: NEGATIVE mg/dL
Specific Gravity, Urine: 1.01 (ref 1.005–1.030)
pH: 6 (ref 5.0–8.0)

## 2023-11-12 LAB — COMPREHENSIVE METABOLIC PANEL
ALT: 16 U/L (ref 0–44)
AST: 16 U/L (ref 15–41)
Albumin: 4 g/dL (ref 3.5–5.0)
Alkaline Phosphatase: 65 U/L (ref 38–126)
Anion gap: 8 (ref 5–15)
BUN: 18 mg/dL (ref 8–23)
CO2: 24 mmol/L (ref 22–32)
Calcium: 8.9 mg/dL (ref 8.9–10.3)
Chloride: 102 mmol/L (ref 98–111)
Creatinine, Ser: 0.95 mg/dL (ref 0.61–1.24)
GFR, Estimated: >60 mL/min (ref >60)
Glucose, Bld: 224 mg/dL — ABNORMAL HIGH (ref 70–99)
Potassium: 3.6 mmol/L (ref 3.5–5.1)
Sodium: 134 mmol/L — ABNORMAL LOW (ref 135–145)
Total Bilirubin: 0.7 mg/dL (ref <1.2)
Total Protein: 7.2 g/dL (ref 6.5–8.1)

## 2023-11-12 LAB — DIFFERENTIAL
Abs Immature Granulocytes: 0.03 10*3/uL (ref 0.00–0.07)
Basophils Absolute: 0.1 10*3/uL (ref 0.0–0.1)
Basophils Relative: 1 %
Eosinophils Absolute: 0.1 10*3/uL (ref 0.0–0.5)
Eosinophils Relative: 1 %
Immature Granulocytes: 0 %
Lymphocytes Relative: 15 %
Lymphs Abs: 1.2 10*3/uL (ref 0.7–4.0)
Monocytes Absolute: 0.5 10*3/uL (ref 0.1–1.0)
Monocytes Relative: 6 %
Neutro Abs: 6.3 10*3/uL (ref 1.7–7.7)
Neutrophils Relative %: 77 %

## 2023-11-12 LAB — CBC
HCT: 37.7 % — ABNORMAL LOW (ref 39.0–52.0)
Hemoglobin: 12.8 g/dL — ABNORMAL LOW (ref 13.0–17.0)
MCH: 31.9 pg (ref 26.0–34.0)
MCHC: 34 g/dL (ref 30.0–36.0)
MCV: 94 fL (ref 80.0–100.0)
Platelets: 260 10*3/uL (ref 150–400)
RBC: 4.01 MIL/uL — ABNORMAL LOW (ref 4.22–5.81)
RDW: 11.2 % — ABNORMAL LOW (ref 11.5–15.5)
WBC: 8.2 10*3/uL (ref 4.0–10.5)
nRBC: 0 % (ref 0.0–0.2)

## 2023-11-12 LAB — RAPID URINE DRUG SCREEN, HOSP PERFORMED
Amphetamines: NOT DETECTED
Barbiturates: NOT DETECTED
Benzodiazepines: NOT DETECTED
Cocaine: NOT DETECTED
Opiates: NOT DETECTED
Tetrahydrocannabinol: NOT DETECTED

## 2023-11-12 LAB — ETHANOL: Alcohol, Ethyl (B): <10 mg/dL (ref <10)

## 2023-11-12 LAB — APTT: aPTT: 27 s (ref 24–36)

## 2023-11-12 LAB — PROTIME-INR
INR: 1 (ref 0.8–1.2)
Prothrombin Time: 13.4 s (ref 11.4–15.2)

## 2023-11-12 MED ORDER — PROCHLORPERAZINE EDISYLATE 10 MG/2ML IJ SOLN
10.0000 mg | Freq: Once | INTRAMUSCULAR | Status: AC
  Administered 2023-11-12: 10 mg via INTRAVENOUS
  Filled 2023-11-12: qty 2

## 2023-11-12 MED ORDER — DIPHENHYDRAMINE HCL 50 MG/ML IJ SOLN
12.5000 mg | Freq: Once | INTRAMUSCULAR | Status: AC
  Administered 2023-11-12: 12.5 mg via INTRAVENOUS
  Filled 2023-11-12: qty 1

## 2023-11-12 NOTE — ED Provider Notes (Signed)
[facility] Provider Note   CSN: [id number] Arrival date & time: 11/12/23  1419     History  Chief Complaint  Patient presents with   Headache   Gait Problem    Cory Romero is a 67 y.o. male with a past medical history significant for hypertension, hyperlipidemia, and history of CVA who presents to the ED due to intermittent headaches for the past week.  Headaches have worsened in intensity and frequency over the past 3 days.  Wife at bedside states the patient has been grabbing his head frequently throughout the day complaining of a headache. Headache located on top portion of his head. Patient has some cognitive disability and speech changes since his CVA roughly 2 years ago.  Patient was seen by PCP and had a CT head on 11/27 which was negative for any acute abnormalities.  Did demonstrate some chronic infarcts. At that time, he was referred to neurology.  Wife also notes patient's been dragging his right leg which is atypical for patient.  No recent head injury.  Not on any blood thinners.  No vision changes.  No changes to speech from his baseline following CVA 2 years ago. Patient denies any unilateral weakness; however family friend at bedside notes that his cognitive ability is limit and it is hard for him to understand questions?? No recent fever. No neck stiffness. Wife notes patient has required  History obtained from patient and past medical records. Official interpreter used during entire encounter     Home Medications Prior to Admission medications   Medication Sig Start Date End Date Taking? Authorizing Provider  amLODipine (NORVASC) 5 MG tablet Take 1 tablet (5 mg total) by mouth daily. 09/24/23   Saguier, Ramon Dredge, PA-C  losartan (COZAAR) 50 MG tablet Take 1 tablet (50 mg total) by mouth daily. 09/24/23   Saguier, Ramon Dredge, PA-C  rosuvastatin (CRESTOR) 40 MG tablet Take 1 tablet (40 mg total) by mouth daily. 09/24/23    Saguier, Ramon Dredge, PA-C      Allergies    Patient has no known allergies.    Review of Systems   Review of Systems  Constitutional:  Negative for fever.  Eyes:  Negative for visual disturbance.  Neurological:  Positive for weakness and headaches.    Physical Exam Updated Vital Signs BP (!) 174/93   Pulse (!) 102   Temp 98 F (36.7 C) (Oral)   Resp (!) 24   Ht 5\' 6"  (1.676 m)   Wt 68.3 kg   SpO2 98%   BMI 24.30 kg/m  Physical Exam Vitals and nursing note reviewed.  Constitutional:      General: He is not in acute distress.    Appearance: He is not ill-appearing.  HENT:     Head: Normocephalic.  Eyes:     Pupils: Pupils are equal, round, and reactive to light.  Cardiovascular:     Rate and Rhythm: Normal rate and regular rhythm.     Pulses: Normal pulses.     Heart sounds: Normal heart sounds. No murmur heard.    No friction rub. No gallop.  Pulmonary:     Effort: Pulmonary effort is normal.     Breath sounds: Normal breath sounds.  Abdominal:     General: Abdomen is flat. There is no distension.     Palpations: Abdomen is soft.     Tenderness: There is no abdominal tenderness. There is no guarding or rebound.  Musculoskeletal:  General: Normal range of motion.     Cervical back: Neck supple.  Skin:    General: Skin is warm and dry.  Neurological:     General: No focal deficit present.     Mental Status: He is alert.     Comments: Speech is clear, able to follow commands CN III-XII intact Normal strength in upper and lower extremities bilaterally including dorsiflexion and plantar flexion, strong and equal grip strength Sensation grossly intact throughout Moves extremities without ataxia, coordination intact No pronator drift   Psychiatric:        Mood and Affect: Mood normal.        Behavior: Behavior normal.     ED Results / Procedures / Treatments   Labs (all labs ordered are listed, but only abnormal results are displayed) Labs Reviewed   CBC - Abnormal; Notable for the following components:      Result Value   RBC 4.01 (*)    Hemoglobin 12.8 (*)    HCT 37.7 (*)    RDW 11.2 (*)    All other components within normal limits  COMPREHENSIVE METABOLIC PANEL - Abnormal; Notable for the following components:   Sodium 134 (*)    Glucose, Bld 224 (*)    All other components within normal limits  ETHANOL  PROTIME-INR  APTT  DIFFERENTIAL  RAPID URINE DRUG SCREEN, HOSP PERFORMED  URINALYSIS, ROUTINE W REFLEX MICROSCOPIC    EKG None  Radiology MR BRAIN WO CONTRAST  Result Date: 11/12/2023 CLINICAL DATA:  Neuro deficit with acute stroke suspected. Irritated with difficult gait EXAM: MRI HEAD WITHOUT CONTRAST TECHNIQUE: Multiplanar, multiecho pulse sequences of the brain and surrounding structures were obtained without intravenous contrast. COMPARISON:  Head CT from earlier today FINDINGS: Brain: Small acute infarct in the posterior left pons, at the floor of the fourth ventricle. There is a background of severe chronic small vessel ischemia with extensive gliosis in the cerebral white matter, deep gray nuclei, and pons. Multiple chronic infarcts in the pons and deep gray nuclei/deep white matter. Innumerable microhemorrhages primarily in the deep brain and correlating with a hypertensive encephalopathy pattern. No acute hemorrhage, hydrocephalus, or masslike finding. Vascular: Major flow voids are preserved Skull and upper cervical spine: Normal marrow signal Sinuses/Orbits: Negative IMPRESSION: 1. Punctate acute infarct in the posterior left pons along the floor of the fourth ventricle. 2. Severe chronic small vessel ischemia. Electronically Signed   By: Tiburcio Pea M.D.   On: 11/12/2023 18:47   CT HEAD WO CONTRAST  Result Date: 11/12/2023 CLINICAL DATA:  Provided history: Neuro deficit, acute, stroke suspected. Additional history provided: headache, confusion. EXAM: CT HEAD WITHOUT CONTRAST TECHNIQUE: Contiguous axial images  were obtained from the base of the skull through the vertex without intravenous contrast. RADIATION DOSE REDUCTION: This exam was performed according to the departmental dose-optimization program which includes automated exposure control, adjustment of the mA and/or kV according to patient size and/or use of iterative reconstruction technique. COMPARISON:  Head CT 10/29/2023. FINDINGS: Brain: Generalized cerebral atrophy. Chronic lacunar infarcts again noted within the left subinsular white matter and central pons. Advanced patchy and ill-defined hypoattenuation elsewhere within the cerebral white matter and bilateral thalami, nonspecific but likely reflecting chronic small vessel ischemic disease given the patient's history of hypertension, hyperlipidemia and stroke. There is no acute intracranial hemorrhage. No demarcated cortical infarct. No extra-axial fluid collection. No evidence of an intracranial mass. No midline shift. Vascular: No hyperdense vessel.  Atherosclerotic calcifications. Skull: No calvarial fracture or aggressive  osseous lesion. Sinuses/Orbits: No mass or acute finding within the imaged orbits. Mild mucosal thickening within the right frontal, bilateral ethmoid and right sphenoid sinuses. IMPRESSION: 1.  No evidence of an acute intracranial abnormality. 2. Chronic lacunar infarcts again noted within the left subinsular white matter and central pons. 3. Advanced patchy and ill-defined hypoattenuation elsewhere within the cerebral white matter and thalami, nonspecific but likely reflecting chronic small vessel ischemic disease (given the patient's history of hypertension, hyperlipidemia and stroke). 4. Generalized cerebral atrophy. 5. Paranasal sinus disease at the imaged levels, as described. Electronically Signed   By: Jackey Loge D.O.   On: 11/12/2023 15:49    Procedures .Critical Care  Performed by: Mannie Stabile, PA-C Authorized by: Mannie Stabile, PA-C   Critical care  provider statement:    Critical care time (minutes):  31   Critical care was necessary to treat or prevent imminent or life-threatening deterioration of the following conditions:  CNS failure or compromise   Critical care was time spent personally by me on the following activities:  Development of treatment plan with patient or surrogate, discussions with consultants, evaluation of patient's response to treatment, examination of patient, ordering and review of laboratory studies, ordering and review of radiographic studies, ordering and performing treatments and interventions, pulse oximetry, re-evaluation of patient's condition and review of old charts   I assumed direction of critical care for this patient from another provider in my specialty: no     Care discussed with: admitting provider       Medications Ordered in ED Medications  prochlorperazine (COMPAZINE) injection 10 mg (10 mg Intravenous Given 11/12/23 1514)  diphenhydrAMINE (BENADRYL) injection 12.5 mg (12.5 mg Intravenous Given 11/12/23 1515)    ED Course/ Medical Decision Making/ A&P Clinical Course as of 11/12/23 2003  Wed Nov 12, 2023  1904 Reassessed patient at bedside, headache resolved. MRI demonstrates acute CVA. Will discuss with neurology. [CA]    Clinical Course User Index [CA] Mannie Stabile, PA-C                                 Medical Decision Making Amount and/or Complexity of Data Reviewed Independent Historian: spouse External Data Reviewed: notes. Labs: ordered. Decision-making details documented in ED Course. Radiology: ordered and independent interpretation performed. Decision-making details documented in ED Course. ECG/medicine tests: ordered and independent interpretation performed. Decision-making details documented in ED Course.  Risk Prescription drug management. Decision regarding hospitalization.   This patient presents to the ED for concern of headache, gait difficulties, this  involves an extensive number of treatment options, and is a complaint that carries with it a high risk of complications and morbidity.  The differential diagnosis includes CVA, metabolic derangement, infection, etc  67 year old male presents to the ED due to worsening headaches that been present for the past week however, worsened over the past 3 days.  Had a CT head on 11/27 which demonstrated old infarcts however, no acute abnormalities.  Had a CVA 2 years ago.  Recently started dragging his right leg.  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  Wife and family friend at bedside.  Upon arrival patient afebrile, not tachycardic or hypoxic.  Patient in no acute distress.  No meningismus on exam to suggest meningitis.  Neurological exam difficult to perform however, appears nonfocal.  CT head ordered.  Stroke labs ordered.  Patient will likely need MRI given new leg  dragging. Migraine cocktail given. Out of TNK window.  CBC reassuring.  No leukocytosis.  Hemoglobin at 12.8.  CMP with hyperglycemia 224.  No anion gap.  Low suspicion for DKA.  Normal renal function.  UA negative for signs of infection.  CT head personally reviewed and interpreted which is negative for any acute abnormalities.  Does demonstrate chronic infarcts.  MRI demonstrates acute infarct.  7:47 PM Discussed with Dr. Wilford Corner with neurology who will have neurology see patient upon transfer [facility].   8:10 PM Discussed with Dr. Antionette Char with [facility] who agrees to admit patient   Co morbidities that complicate the patient evaluation  HTN, HLD Cardiac Monitoring: / EKG:  The patient was maintained on a cardiac monitor.  I personally viewed and interpreted the cardiac monitored which showed an underlying rhythm of: NSR  Social Determinants of Health:  Language barrier- interpreter used during entire encounter  Test / Admission - Considered:  Patient will require admission for CVA        Final Clinical  Impression(s) / ED Diagnoses Final diagnoses:  Acute CVA (cerebrovascular accident) [facility])    Rx / DC Orders ED Discharge Orders     None         Mannie Stabile, PA-C 11/12/23 2011    Tanda Rockers A, DO 11/15/23 [phone]

## 2023-11-12 NOTE — Progress Notes (Signed)
Plan of Care Note for accepted transfer   Patient: Cory Romero MRN: [id number]   DOA: 11/12/2023  Facility requesting transfer: [facility]   Requesting Provider: Dr. Audelia Acton  Reason for transfer: Stroke workup   Facility course: 67 yr old man with hx of hypertension and CVA with residual speech deficit who presents with headache and dragging his right leg since 11/09/2023.  MRI brain reveals acute left pontine infarct.  Neurology (Dr. Wilford Corner) recommended admission for CVA workup.  Plan of care: The patient is accepted for admission to Telemetry unit, at [facility].   Author: Briscoe Deutscher, MD 11/12/2023  Check [email or web address] for on-call coverage.  Nursing staff, Please call [facility] Admits & Consults System-Wide number on Amion as soon as patient's arrival, so appropriate admitting provider can evaluate the pt.

## 2023-11-12 NOTE — ED Triage Notes (Signed)
Per family headaches since Sunday, grabs head and get irritated, states also having difficulty with gait since Sunday  Pcp told to come to ER   Pt had stroke approx 2 years ago, with verbal deficits

## 2023-11-13 DIAGNOSIS — I10 Essential (primary) hypertension: Secondary | ICD-10-CM | POA: Insufficient documentation

## 2023-11-13 DIAGNOSIS — E785 Hyperlipidemia, unspecified: Secondary | ICD-10-CM | POA: Insufficient documentation

## 2023-11-13 DIAGNOSIS — I639 Cerebral infarction, unspecified: Secondary | ICD-10-CM | POA: Diagnosis not present

## 2023-11-13 LAB — CBC
HCT: 41.5 % (ref 39.0–52.0)
Hemoglobin: 14.1 g/dL (ref 13.0–17.0)
MCH: 32 pg (ref 26.0–34.0)
MCHC: 34 g/dL (ref 30.0–36.0)
MCV: 94.3 fL (ref 80.0–100.0)
Platelets: 290 10*3/uL (ref 150–400)
RBC: 4.4 MIL/uL (ref 4.22–5.81)
RDW: 11.2 % — ABNORMAL LOW (ref 11.5–15.5)
WBC: 8.4 10*3/uL (ref 4.0–10.5)
nRBC: 0 % (ref 0.0–0.2)

## 2023-11-13 LAB — CREATININE, SERUM
Creatinine, Ser: 1.11 mg/dL (ref 0.61–1.24)
GFR, Estimated: >60 mL/min (ref >60)

## 2023-11-13 LAB — HIV ANTIBODY (ROUTINE TESTING W REFLEX): HIV Screen 4th Generation wRfx: NONREACTIVE

## 2023-11-13 LAB — HEMOGLOBIN A1C
Hgb A1c MFr Bld: 5.2 % (ref 4.8–5.6)
Mean Plasma Glucose: 102.54 mg/dL

## 2023-11-13 MED ORDER — CLOPIDOGREL BISULFATE 75 MG PO TABS
300.0000 mg | ORAL_TABLET | Freq: Once | ORAL | Status: AC
  Administered 2023-11-13: 300 mg via ORAL
  Filled 2023-11-13: qty 4

## 2023-11-13 MED ORDER — ROSUVASTATIN CALCIUM 20 MG PO TABS
40.0000 mg | ORAL_TABLET | Freq: Every day | ORAL | Status: DC
  Administered 2023-11-13 – 2023-11-14 (×2): 40 mg via ORAL
  Filled 2023-11-13 (×2): qty 2

## 2023-11-13 MED ORDER — ENOXAPARIN SODIUM 40 MG/0.4ML IJ SOSY
40.0000 mg | PREFILLED_SYRINGE | INTRAMUSCULAR | Status: DC
  Administered 2023-11-13: 40 mg via SUBCUTANEOUS
  Filled 2023-11-13: qty 0.4

## 2023-11-13 MED ORDER — ZOLPIDEM TARTRATE 5 MG PO TABS
10.0000 mg | ORAL_TABLET | Freq: Every evening | ORAL | Status: DC | PRN
  Administered 2023-11-13: 10 mg via ORAL
  Filled 2023-11-13: qty 2

## 2023-11-13 MED ORDER — SENNOSIDES-DOCUSATE SODIUM 8.6-50 MG PO TABS: 1.0000 | ORAL_TABLET | Freq: Every evening | ORAL | Status: DC | PRN

## 2023-11-13 MED ORDER — SODIUM CHLORIDE 0.9 % IV SOLN: INTRAVENOUS | Status: DC

## 2023-11-13 MED ORDER — ACETAMINOPHEN 160 MG/5ML PO SOLN: 650.0000 mg | ORAL | Status: DC | PRN

## 2023-11-13 MED ORDER — STROKE: EARLY STAGES OF RECOVERY BOOK
Freq: Once | Status: AC
  Filled 2023-11-13: qty 1

## 2023-11-13 MED ORDER — CLOPIDOGREL BISULFATE 75 MG PO TABS: 75.0000 mg | ORAL_TABLET | Freq: Every day | ORAL | Status: DC

## 2023-11-13 MED ORDER — LOSARTAN POTASSIUM 50 MG PO TABS
50.0000 mg | ORAL_TABLET | Freq: Every day | ORAL | Status: DC
  Administered 2023-11-13 – 2023-11-14 (×2): 50 mg via ORAL
  Filled 2023-11-13 (×2): qty 1

## 2023-11-13 MED ORDER — ACETAMINOPHEN 325 MG PO TABS
650.0000 mg | ORAL_TABLET | ORAL | Status: DC | PRN
Start: 2023-11-13 — End: 2023-11-14

## 2023-11-13 MED ORDER — ACETAMINOPHEN 650 MG RE SUPP
650.0000 mg | RECTAL | Status: DC | PRN
Start: 2023-11-13 — End: 2023-11-14

## 2023-11-13 MED ORDER — AMLODIPINE BESYLATE 5 MG PO TABS
5.0000 mg | ORAL_TABLET | Freq: Every day | ORAL | Status: DC
  Administered 2023-11-13 – 2023-11-14 (×2): 5 mg via ORAL
  Filled 2023-11-13 (×2): qty 1

## 2023-11-13 NOTE — Plan of Care (Signed)

## 2023-11-13 NOTE — ED Notes (Signed)
Spoken to [facility] currently their are no beds at the moment. No ETA of transport either patient still waiting for a bed. Received call at 02:41 am

## 2023-11-13 NOTE — Consult Note (Addendum)
NEUROLOGY CONSULT NOTE   Date of service: November 13, 2023 Patient Name: Cory Romero MRN:  [id number] DOB:  08/25/1956 Chief Complaint: "subacute L pontine CVA" Requesting Provider: Hughie Closs, MD  History of Present Illness  Cory Romero is a 67 y.o. 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  History obtained with use of interpreter, information obtained largely from his wife.   Patient has baseline dysarthria since CVA 11/2021 however he was noted to have dragging of his R foot on Sunday afternoon. He also had intermittent headaches and his dysarthria is worse as well, in addition to seeming more confused. Patient says that his R foot weakness is improved from when he presented for evaluation yesterday.  Since his stroke in 2022 his wife has noticed progression of retrograde memory loss and that he needs more assistance around the home due to not only imbalance when moving but because he is easily lost (for example he cannot find the bathroom even if it is right in front of him). In that time he has also exhibited more of a child-like demeanor.   Does not use tobacco, alcohol, or other substances.  LKW: 12/08 afternoon Modified rankin score: 3-Moderate disability-requires help but walks WITHOUT assistance IV Thrombolysis: No (out of window) EVT: No   NIHSS components Score: Comment  1a Level of Conscious 0[] 1[] 2[] 3[]     1b LOC Questions 0[] 1[] 2[]      1c LOC Commands 0[] 1[] 2[]      2 Best Gaze 0[] 1[] 2[]      3 Visual 0[] 1[] 2[] 3[]     4 Facial Palsy 0[] 1[] 2[] 3[]     5a Motor Arm - left 0[] 1[] 2[] 3[] 4[] UN[]   5b Motor Arm - Right 0[] 1[] 2[] 3[] 4[] UN[]   6a Motor Leg - Left 0[] 1[] 2[] 3[] 4[] UN[]   6b Motor Leg - Right 0[] 1[] 2[] 3[] 4[] UN[]   7 Limb Ataxia 0[] 1[] 2[] 3[] UN[]    8 Sensory 0[] 1[] 2[] UN[]     9 Best Language 0[] 1[] 2[]  3[]     10 Dysarthria 0[] 1[] 2[] UN[]     11  Extinct. and Inattention 0[]  1[]  2[]       TOTAL:       ROS  Comprehensive ROS performed and pertinent positives documented in HPI   Past History   Past Medical History: Past Medical History:  Diagnosis Date   History of stroke    Hyperlipidemia    Hypertension    Past Surgical History: History reviewed. No pertinent surgical history.  Family History: History reviewed. No pertinent family history.  Social History  reports that he quit smoking about 6 years ago. His smoking use included cigarettes. He started smoking about 47 years ago. He has a 61.5 pack-year smoking history. He has never used smokeless tobacco. He reports that he does not currently use alcohol. He reports that he does not use drugs.  Allergies: Allergies  Allergen Reactions   Aspirin Swelling and Other (See Comments)    Facial swelling    Medications   Current Facility-Administered Medications:    [START ON 11/14/2023]  stroke: early stages of recovery book, , Does not apply, Once, Cory Closs, MD   acetaminophen (TYLENOL) tablet 650 mg, 650 mg, Oral, Q4H PRN **OR** acetaminophen (TYLENOL)  160 MG/5ML solution 650 mg, 650 mg, Per Tube, Q4H PRN **OR** acetaminophen (TYLENOL) suppository 650 mg, 650 mg, Rectal, Q4H PRN, Romero, Ravi, MD   amLODipine (NORVASC) tablet 5 mg, 5 mg, Oral, Daily, Romero, Ravi, MD   clopidogrel (PLAVIX) tablet 300 mg, 300 mg, Oral, Once, Romero, Ravi, MD   enoxaparin (LOVENOX) injection 40 mg, 40 mg, Subcutaneous, Q24H, Romero, Ravi, MD   losartan (COZAAR) tablet 50 mg, 50 mg, Oral, Daily, Romero, Ravi, MD   rosuvastatin (CRESTOR) tablet 40 mg, 40 mg, Oral, Daily, Romero, Ravi, MD   senna-docusate (Senokot-S) tablet 1 tablet, 1 tablet, Oral, QHS PRN, Cory Closs, MD  Vitals   Vitals:   11/13/23 0930 11/13/23 1103 11/13/23 1234 11/13/23 1330  BP: (!) 143/88  (!) 140/93 (!) 163/92  Pulse: (!) 104  97 84  Resp: (!) 22  (!)  23 (!) 22  Temp:  97.6 F (36.4 C) 98 F (36.7 C)   TempSrc:  Oral    SpO2: 95%  96% 92%  Weight:      Height:        Body mass index is 24.3 kg/m.  Physical Exam   Constitutional: Appears older than stated age, in no acute distress. Psych: Affect appropriate to situation.  Eyes: No scleral injection.  HENT: No OP obstruction.  Head: Normocephalic.  Cardiovascular: Tachycardic. Respiratory: Effort normal, non-labored breathing.  Skin: WDI.  No increased warmth, erythema, or edema between LE.  Neurologic Examination   Mental Status: Patient is awake, alert, oriented to self. No signs of aphasia or neglect. Mild dysarthria.  Pronator drift on the right. Cranial Nerves: II: Pupils equal, round, and reactive to light.   III,IV, VI: EOMI without ptosis or diploplia.  V: Facial sensation is symmetric to light touch and temperature. VII: L facial droop. VIII: Hearing is intact to voice XI: Shoulder shrug is symmetric.  Motor: Normal effort thorughout, at least 5/5 bilateral UE, 5/5 bilateral LE. Sensory: Sensation is grossly intact and equal bilateral UE & LE. Cerebellar: Finger-Nose abnormal on L.  Labs/Imaging/Neurodiagnostic studies   CBC:  Recent Labs  Lab 11-25-23 1504  WBC 8.2  NEUTROABS 6.3  HGB 12.8*  HCT 37.7*  MCV 94.0  PLT 260   Basic Metabolic Panel:  Lab Results  Component Value Date   NA 134 (L) 11/25/23   K 3.6 2023/11/25   CO2 24 11-25-2023   GLUCOSE 224 (H) 11/25/23   BUN 18 2023-11-25   CREATININE 0.95 November 25, 2023   CALCIUM 8.9 11/25/23   GFRNONAA >60 2023/11/25   Lipid Panel:  Lab Results  Component Value Date   LDLCALC 140 (H) 09/24/2023   HgbA1c: No results found for: "HGBA1C" Urine Drug Screen:     Component Value Date/Time   LABOPIA NONE DETECTED 25-Nov-2023 1921   COCAINSCRNUR NONE DETECTED 2023-11-25 1921   LABBENZ NONE DETECTED 11/25/23 1921   AMPHETMU NONE DETECTED 11-25-2023 1921   THCU NONE DETECTED 11/25/23  1921   LABBARB NONE DETECTED Nov 25, 2023 1921    Alcohol Level     Component Value Date/Time   ETH <10 25-Nov-2023 1504   INR  Lab Results  Component Value Date   INR 1.0 2023-11-25   APTT  Lab Results  Component Value Date   APTT 27 11-25-23   AED levels: No results found for: "PHENYTOIN", "ZONISAMIDE", "LAMOTRIGINE", "LEVETIRACETA"  CT Head without contrast: 1.  No evidence of an acute intracranial abnormality. 2. Chronic lacunar infarcts again noted within the left subinsular white matter  and central pons. 3. Advanced patchy and ill-defined hypoattenuation elsewhere within the cerebral white matter and thalami, nonspecific but likely reflecting chronic small vessel ischemic disease (given the patient's history of hypertension, hyperlipidemia and stroke). 4. Generalized cerebral atrophy. 5. Paranasal sinus disease at the imaged levels, as described.  MRI Brain: 1. Punctate acute infarct in the posterior left pons along the floor of the fourth ventricle. 2. Severe chronic small vessel ischemia.  ASSESSMENT   Mete Stahlberg is a 67 y.o. 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  RECOMMENDATIONS   -Risk factor modification: Lipid panel, goal LDL <70, HbA1c -Echocardiogram -CTA head and neck -Admit to telemetry -Frequent neuro checks -Plavix 300 mg x1, then 75 mg daily  -Allergic to ASA -Goal normotension -PT, OT, SLP -Neurology will continue to follow ______________________________________________________________________  Signed, Cory Mungo, DO Internal Medicine PGY-3  I have seen the patient reviewed the above note.  Given his wife's description of "childlike" behavior since his previous stroke, I strongly suspect this patient has some underlying dementia, likely vascular.    He has had an acute pontine stroke, likely small vessel in etiology and is undergoing workup  for secondary risk factor modification.  Ritta Slot, MD [facility] [phone]  If 7pm- 7am, please page neurology on call as listed in AMION.

## 2023-11-13 NOTE — Progress Notes (Signed)
   11/13/23 1559  TOC Brief Assessment  Insurance and Status Reviewed Administrator [employer] Q[employer])  Patient has primary care physician Yes (Saguier, Ramon Dredge, PA-C)  Home environment has been reviewed From home with spouse  Prior level of function: independent; some cognitive disability and speech changes since his CVA approx 2 years ago.  Prior/Current Home Services No current home services (No History in [place])  Social Drivers of Health Review SDOH reviewed no interventions necessary  Readmission risk has been reviewed Yes (N/A Listed)  Transition of care needs no transition of care needs at this time   Patient here for CVA workup. From home with Spouse  No TOC needs identified at this time but please place a consult if a TOC need does arise

## 2023-11-13 NOTE — Plan of Care (Signed)

## 2023-11-13 NOTE — ED Notes (Signed)
Called 5 west to give report and RN busy at time so she will call me back Ricard Dillon)

## 2023-11-13 NOTE — H&P (Addendum)
History and Physical    [place] ZOX:[id number] DOB: 1956/03/16 DOA: 11/12/2023  PCP: [name], PA-C  Patient coming from: Home[facility]   I have personally briefly reviewed patient's old medical records in [facility]  Chief Complaint: Right foot weakness  HPI: Cory Romero is a 67 y.o. male with medical history significant of previous CVA leaving him with some dysarthria which happened in December 2022 [place], hypertension and hyperlipidemia presented to med [employer] [facility] on 11/12/2023 with 3-day history of dragging of the right foot which started on Sunday, 11/09/2023.  Patient is from [place], has been in the [place] for 2 years, only speaks [place].  Son and wife at the bedside.  In person Spanish interpreter used for obtaining history.  Patient started dragging his right foot on Sunday as mentioned above.  He was also complaining of intermittent headache and was grabbing his head.  Was brought to the ED yesterday.  Was diagnosed with left pontine acute ischemic stroke.  Per wife, he was slightly delirious yesterday.  Upon further questioning, she tells me that patient does not have any formal diagnosis of dementia however lately for last 1 to 2 years, they have noticed that patient is having some short-term memory issues.  Currently patient has no complaint.  His weakness in the right foot has improved compared to yesterday.  Patient is alert, oriented to self and can also tell his birthday but he thinks that he is still in the medical [place] and could not tell current month or the year.  ED Course: Patient was slightly hypertensive and tachycardic upon arrival to ED yesterday.  Mild hyponatremia.  Hyperglycemic.  Mild anemia.  MRI confirmed acute ischemic stroke.  Neurology consulted, they recommended admission for further stroke workup.  Patient did not receive any medications in the ED.  Review of Systems: As per HPI  otherwise negative.    Past Medical History:  Diagnosis Date   History of stroke    Hyperlipidemia    Hypertension     History reviewed. No pertinent surgical history.   reports that he quit smoking about 6 years ago. His smoking use included cigarettes. He started smoking about 47 years ago. He has a 61.5 pack-year smoking history. He has never used smokeless tobacco. He reports that he does not currently use alcohol. He reports that he does not use drugs.  Allergies  Allergen Reactions   Aspirin Swelling and Other (See Comments)    Facial swelling    History reviewed. No pertinent family history.  Prior to Admission medications   Medication Sig Start Date End Date Taking? Authorizing Provider  amLODipine (NORVASC) 5 MG tablet Take 1 tablet (5 mg total) by mouth daily. 09/24/23   Saguier, Ramon Dredge, PA-C  losartan (COZAAR) 50 MG tablet Take 1 tablet (50 mg total) by mouth daily. 09/24/23   Saguier, Ramon Dredge, PA-C  rosuvastatin (CRESTOR) 40 MG tablet Take 1 tablet (40 mg total) by mouth daily. 09/24/23   [name], PA-C    Physical Exam: Vitals:   11/13/23 0930 11/13/23 1103 11/13/23 1234 11/13/23 1330  BP: (!) 143/88  (!) 140/93 (!) 163/92  Pulse: (!) 104  97 84  Resp: (!) 22  (!) 23 (!) 22  Temp:  97.6 F (36.4 C) 98 F (36.7 C)   TempSrc:  Oral    SpO2: 95%  96% 92%  Weight:      Height:        Constitutional: NAD,  calm, comfortable Vitals:   11/13/23 0930 11/13/23 1103 11/13/23 1234 11/13/23 1330  BP: (!) 143/88  (!) 140/93 (!) 163/92  Pulse: (!) 104  97 84  Resp: (!) 22  (!) 23 (!) 22  Temp:  97.6 F (36.4 C) 98 F (36.7 C)   TempSrc:  Oral    SpO2: 95%  96% 92%  Weight:      Height:       Eyes: PERRL, lids and conjunctivae normal ENMT: Mucous membranes are moist. Posterior pharynx clear of any exudate or lesions.Normal dentition.  Neck: normal, supple, no masses, no thyromegaly Respiratory: clear to auscultation bilaterally, no wheezing, no  crackles. Normal respiratory effort. No accessory muscle use.  Cardiovascular: Regular rate and rhythm, no murmurs / rubs / gallops. No extremity edema. 2+ pedal pulses. No carotid bruits.  Abdomen: no tenderness, no masses palpated. No hepatosplenomegaly. Bowel sounds positive.  Musculoskeletal: no clubbing / cyanosis. No joint deformity upper and lower extremities. Good ROM, no contractures. Normal muscle tone.  Skin: no rashes, lesions, ulcers. No induration Neurologic: CN 2-12 grossly intact. Sensation intact, DTR normal. Strength 5/5 in all 4.  Has some dysarthria which is chronic and has some right facial droop. Psychiatric: Alert and oriented x 1. Normal mood.    Labs on Admission: I have personally reviewed following labs and imaging studies  CBC: Recent Labs  Lab 11/12/23 1504  WBC 8.2  NEUTROABS 6.3  HGB 12.8*  HCT 37.7*  MCV 94.0  PLT 260   Basic Metabolic Panel: Recent Labs  Lab 11/12/23 1504  NA 134*  K 3.6  CL 102  CO2 24  GLUCOSE 224*  BUN 18  CREATININE 0.95  CALCIUM 8.9   GFR: Estimated Creatinine Clearance: 70 mL/min (by C-G formula based on SCr of 0.95 mg/dL). Liver Function Tests: Recent Labs  Lab 11/12/23 1504  AST 16  ALT 16  ALKPHOS 65  BILITOT 0.7  PROT 7.2  ALBUMIN 4.0   No results for input(s): "LIPASE", "AMYLASE" in the last 168 hours. No results for input(s): "AMMONIA" in the last 168 hours. Coagulation Profile: Recent Labs  Lab 11/12/23 1504  INR 1.0   Cardiac Enzymes: No results for input(s): "CKTOTAL", "CKMB", "CKMBINDEX", "TROPONINI" in the last 168 hours. BNP (last 3 results) No results for input(s): "PROBNP" in the last 8760 hours. HbA1C: No results for input(s): "HGBA1C" in the last 72 hours. CBG: No results for input(s): "GLUCAP" in the last 168 hours. Lipid Profile: No results for input(s): "CHOL", "HDL", "LDLCALC", "TRIG", "CHOLHDL", "LDLDIRECT" in the last 72 hours. Thyroid Function Tests: No results for  input(s): "TSH", "T4TOTAL", "FREET4", "T3FREE", "THYROIDAB" in the last 72 hours. Anemia Panel: No results for input(s): "VITAMINB12", "FOLATE", "FERRITIN", "TIBC", "IRON", "RETICCTPCT" in the last 72 hours. Urine analysis:    Component Value Date/Time   COLORURINE YELLOW 11/12/2023 1921   APPEARANCEUR CLEAR 11/12/2023 1921   LABSPEC 1.010 11/12/2023 1921   PHURINE 6.0 11/12/2023 1921   GLUCOSEU NEGATIVE 11/12/2023 1921   HGBUR NEGATIVE 11/12/2023 1921   BILIRUBINUR NEGATIVE 11/12/2023 1921   BILIRUBINUR negative 09/24/2023 1149   KETONESUR NEGATIVE 11/12/2023 1921   PROTEINUR NEGATIVE 11/12/2023 1921   UROBILINOGEN 0.2 09/24/2023 1149   NITRITE NEGATIVE 11/12/2023 1921   LEUKOCYTESUR NEGATIVE 11/12/2023 1921    Radiological Exams on Admission: MR BRAIN WO CONTRAST Result Date: 11/12/2023 CLINICAL DATA:  Neuro deficit with acute stroke suspected. Irritated with difficult gait EXAM: MRI HEAD WITHOUT CONTRAST TECHNIQUE: Multiplanar, multiecho pulse sequences of  the brain and surrounding structures were obtained without intravenous contrast. COMPARISON:  Head CT from earlier today FINDINGS: Brain: Small acute infarct in the posterior left pons, at the floor of the fourth ventricle. There is a background of severe chronic small vessel ischemia with extensive gliosis in the cerebral white matter, deep gray nuclei, and pons. Multiple chronic infarcts in the pons and deep gray nuclei/deep white matter. Innumerable microhemorrhages primarily in the deep brain and correlating with a hypertensive encephalopathy pattern. No acute hemorrhage, hydrocephalus, or masslike finding. Vascular: Major flow voids are preserved Skull and upper cervical spine: Normal marrow signal Sinuses/Orbits: Negative IMPRESSION: 1. Punctate acute infarct in the posterior left pons along the floor of the fourth ventricle. 2. Severe chronic small vessel ischemia. Electronically Signed   By: Tiburcio Pea M.D.   On: 11/12/2023  18:47   CT HEAD WO CONTRAST Result Date: 11/12/2023 CLINICAL DATA:  Provided history: Neuro deficit, acute, stroke suspected. Additional history provided: headache, confusion. EXAM: CT HEAD WITHOUT CONTRAST TECHNIQUE: Contiguous axial images were obtained from the base of the skull through the vertex without intravenous contrast. RADIATION DOSE REDUCTION: This exam was performed according to the departmental dose-optimization program which includes automated exposure control, adjustment of the mA and/or kV according to patient size and/or use of iterative reconstruction technique. COMPARISON:  Head CT 10/29/2023. FINDINGS: Brain: Generalized cerebral atrophy. Chronic lacunar infarcts again noted within the left subinsular white matter and central pons. Advanced patchy and ill-defined hypoattenuation elsewhere within the cerebral white matter and bilateral thalami, nonspecific but likely reflecting chronic small vessel ischemic disease given the patient's history of hypertension, hyperlipidemia and stroke. There is no acute intracranial hemorrhage. No demarcated cortical infarct. No extra-axial fluid collection. No evidence of an intracranial mass. No midline shift. Vascular: No hyperdense vessel.  Atherosclerotic calcifications. Skull: No calvarial fracture or aggressive osseous lesion. Sinuses/Orbits: No mass or acute finding within the imaged orbits. Mild mucosal thickening within the right frontal, bilateral ethmoid and right sphenoid sinuses. IMPRESSION: 1.  No evidence of an acute intracranial abnormality. 2. Chronic lacunar infarcts again noted within the left subinsular white matter and central pons. 3. Advanced patchy and ill-defined hypoattenuation elsewhere within the cerebral white matter and thalami, nonspecific but likely reflecting chronic small vessel ischemic disease (given the patient's history of hypertension, hyperlipidemia and stroke). 4. Generalized cerebral atrophy. 5. Paranasal sinus  disease at the imaged levels, as described. Electronically Signed   By: Jackey Loge D.O.   On: 11/12/2023 15:49    EKG: Independently reviewed.  Sinus rhythm with borderline T wave abdominally.  No acute ST wave changes.  Assessment/Plan Principal Problem:   Acute ischemic stroke [facility]) Active Problems:   Essential hypertension   Hyperlipidemia   Acute ischemic stroke/history of hypertension and hyperlipidemia: -admit forTelemetry monitoring -Stroke protocol No need to allow permissive hypertension since symptoms started 4 days ago.  Will resume his home dose of losartan and amlodipine and placed on as needed hydralazine with the goal to keep systolic blood pressure <160 and diastolic blood pressure <100  -MRI brain without contrast confirmed punctate acute infarct in the left posterior pons and will defer to neurology for MRA head and neck  -Maintain Euthermia.  -No antiplatelets given in the ED.  Patient has allergy to aspirin, discussed with Dr. Amada Jupiter, will load with Plavix 300 mg p.o. -Echocardiogram to- rule out PFO -Lipid Panel, TSH and A1C -Frequent neuro checks -Resume home dose of atorvastatin 40 mg p.o. daily. -Risk factor modification -Consulted  Neurology -PT/OT eval, Speech consult  Hyperglycemia: Blood sugar 224 on BMP yesterday.  Check hemoglobin A1c.  Mild anemia: Hemoglobin 12.8.  Monitor.  Undiagnosed dementia?:  Recommend outpatient neurology follow-up for formal workup.  DVT prophylaxis: enoxaparin (LOVENOX) injection 40 mg Start: 11/13/23 1545 Code Status: Full code Family Communication: Wife and son present at bedside.  Plan of care discussed with patient in length and he verbalized understanding and agreed with it. Disposition Plan: Potential discharge tomorrow Consults called: Neurology  Hughie Closs MD [facility]  *Please note that this is a verbal dictation therefore any spelling or grammatical errors are due to the "[employer] One"  system interpretation.  Please page via Amion and do not message via secure chat for urgent patient care matters. Secure chat can be used for non urgent patient care matters. 11/13/2023, 3:20 PM  To contact the attending provider between 7A-7P or the covering provider during after hours 7P-7A, please log into the web site [email or web address]

## 2023-11-13 NOTE — ED Notes (Signed)
Patient denies pain and is resting comfortably.  

## 2023-11-14 ENCOUNTER — Observation Stay (HOSPITAL_COMMUNITY): Payer: 59

## 2023-11-14 ENCOUNTER — Other Ambulatory Visit (HOSPITAL_COMMUNITY): Payer: Self-pay

## 2023-11-14 DIAGNOSIS — I10 Essential (primary) hypertension: Secondary | ICD-10-CM

## 2023-11-14 DIAGNOSIS — I6389 Other cerebral infarction: Secondary | ICD-10-CM | POA: Diagnosis not present

## 2023-11-14 DIAGNOSIS — E785 Hyperlipidemia, unspecified: Secondary | ICD-10-CM

## 2023-11-14 DIAGNOSIS — I6622 Occlusion and stenosis of left posterior cerebral artery: Secondary | ICD-10-CM | POA: Diagnosis not present

## 2023-11-14 DIAGNOSIS — I6523 Occlusion and stenosis of bilateral carotid arteries: Secondary | ICD-10-CM | POA: Diagnosis not present

## 2023-11-14 DIAGNOSIS — I639 Cerebral infarction, unspecified: Principal | ICD-10-CM | POA: Diagnosis present

## 2023-11-14 DIAGNOSIS — I700 Atherosclerosis of aorta: Secondary | ICD-10-CM | POA: Diagnosis not present

## 2023-11-14 LAB — ECHOCARDIOGRAM COMPLETE
AR max vel: 2.97 cm2
AV Area VTI: 3.37 cm2
AV Area mean vel: 2.88 cm2
AV Mean grad: 3 mm[Hg]
AV Peak grad: 5.8 mm[Hg]
Ao pk vel: 1.2 m/s
Area-P 1/2: 5.97 cm2
Height: 66 in
S' Lateral: 2.2 cm
Weight: 2409.19 [oz_av]

## 2023-11-14 LAB — LIPID PANEL
Cholesterol: 154 mg/dL (ref 0–200)
HDL: 55 mg/dL (ref >40)
LDL Cholesterol: 89 mg/dL (ref 0–99)
Total CHOL/HDL Ratio: 2.8 {ratio}
Triglycerides: 51 mg/dL (ref <150)
VLDL: 10 mg/dL (ref 0–40)

## 2023-11-14 MED ORDER — CLOPIDOGREL BISULFATE 75 MG PO TABS
75.0000 mg | ORAL_TABLET | Freq: Every day | ORAL | Status: DC
  Administered 2023-11-14: 75 mg via ORAL

## 2023-11-14 MED ORDER — CLOPIDOGREL BISULFATE 75 MG PO TABS
75.0000 mg | ORAL_TABLET | Freq: Every day | ORAL | 2 refills | Status: AC
  Filled 2023-11-14: qty 30, 30d supply, fill #0
  Filled 2023-12-11: qty 30, 30d supply, fill #1
  Filled 2024-01-19: qty 30, 30d supply, fill #2
  Filled 2024-01-20: qty 30, 30d supply, fill #0

## 2023-11-14 MED ORDER — IOHEXOL 350 MG/ML SOLN
75.0000 mL | Freq: Once | INTRAVENOUS | Status: AC | PRN
  Administered 2023-11-14: 75 mL via INTRAVENOUS

## 2023-11-14 NOTE — Discharge Summary (Signed)
PATIENT DETAILS Name: Cory Romero Age: 67 y.o. Sex: male Date of Birth: Jun 01, 1956 MRN: [id number]. Admitting Physician: Maretta Bees, MD ONG:EXBMWUX, Kateri Mc  Admit Date: 11/12/2023 Discharge date: 11/14/2023  Recommendations for Outpatient Follow-up:  Follow up with PCP in 1-2 weeks Please obtain CMP/CBC in one week Please ensure follow-up with neurology/stroke clinic Ensure follow-up with cardiology  Admitted From:  Home  Disposition: Home   Discharge Condition: good  CODE STATUS:   Code Status: Full Code   Diet recommendation:  Diet Order             Diet - low sodium heart healthy           Diet Heart Room service appropriate? Yes; Fluid consistency: Thin  Diet effective now                    Brief Summary: 67 year old with prior history of CVA, HTN, HLD who presented with right foot weakness-upon further evaluation-was found to have CVA.  Significant studies  10/23>> LDL: 140 12/11>> MRI brain: Acute infarct left pons. 12/12>> A1c: 5.2 12/13>> CT head/neck: No LVO/stenosis 12/13>> echo: EF 65-70%, severe asymmetric LVH.  Brief Hospital Course: Acute CVA Feels better-no focal deficits on exam Workup as above Neurology recommending continuing Plavix/statin (allergic to ASA)  Hyperglycemia Unclear etiology A1c only 5.2-diabetes ruled out  Cognitive dysfunction Suspected dementia-stable for outpatient follow-up with neurology.  HTN Resume antihypertensives on discharge  Asymmetric severe left ventricular hypertrophy of the septal segment seen on echo Epic message sent to cardiology for outpatient appointment/follow-up for consideration of cardiac MRI.   Discharge Diagnoses:  Principal Problem:   Acute ischemic stroke [facility]) Active Problems:   Essential hypertension   Hyperlipidemia   Acute CVA (cerebrovascular accident) [facility])   Discharge Instructions:  Activity:  As tolerated with Full fall precautions  use walker/cane & assistance as needed  Discharge Instructions     Ambulatory referral to Neurology   Complete by: As directed    Call MD for:  extreme fatigue   Complete by: As directed    Call MD for:  persistant dizziness or light-headedness   Complete by: As directed    Diet - low sodium heart healthy   Complete by: As directed    Discharge instructions   Complete by: As directed    Follow with Primary MD  Saguier, Ramon Dredge, PA-C in 1-2 weeks  Please follow-up with neurology-the office will give you a phone call for a follow-up appointment  Please get a complete blood count and chemistry panel checked by your Primary MD at your next visit, and again as instructed by your Primary MD.  Get Medicines reviewed and adjusted: Please take all your medications with you for your next visit with your Primary MD  Laboratory/radiological data: Please request your Primary MD to go over all hospital tests and procedure/radiological results at the follow up, please ask your Primary MD to get all Hospital records sent to his/her office.  In some cases, they will be blood work, cultures and biopsy results pending at the time of your discharge. Please request that your primary care M.D. follows up on these results.  Also Note the following: If you experience worsening of your admission symptoms, develop shortness of breath, life threatening emergency, suicidal or homicidal thoughts you must seek medical attention immediately by calling 911 or calling your MD immediately  if symptoms less severe.  You must read complete instructions/literature along with all the possible adverse  reactions/side effects for all the Medicines you take and that have been prescribed to you. Take any new Medicines after you have completely understood and accpet all the possible adverse reactions/side effects.   Do not drive when taking Pain medications or sleeping medications (Benzodaizepines)  Do not take more than  prescribed Pain, Sleep and Anxiety Medications. It is not advisable to combine anxiety,sleep and pain medications without talking with your primary care practitioner  Special Instructions: If you have smoked or chewed Tobacco  in the last 2 yrs please stop smoking, stop any regular Alcohol  and or any Recreational drug use.  Wear Seat belts while driving.  Please note: You were cared for by a hospitalist during your hospital stay. Once you are discharged, your primary care physician will handle any further medical issues. Please note that NO REFILLS for any discharge medications will be authorized once you are discharged, as it is imperative that you return to your primary care physician (or establish a relationship with a primary care physician if you do not have one) for your post hospital discharge needs so that they can reassess your need for medications and monitor your lab values.   Increase activity slowly   Complete by: As directed       Allergies as of 11/14/2023       Reactions   Aspirin Swelling, Other (See Comments)   Facial swelling        Medication List     TAKE these medications    acetaminophen 500 MG tablet Commonly known as: TYLENOL Take 500 mg by mouth every 6 (six) hours as needed for moderate pain (pain score 4-6).   amLODipine 5 MG tablet Commonly known as: NORVASC Tome 1 tableta (5 mg en total) por va oral diariamente. (Take 1 tablet (5 mg total) by mouth daily.)   clopidogrel 75 MG tablet Commonly known as: PLAVIX Take 1 tablet (75 mg total) by mouth daily. Start taking on: November 15, 2023   cyanocobalamin 1000 MCG tablet Commonly known as: VITAMIN B12 Take 1,000 mcg by mouth daily.   losartan 50 MG tablet Commonly known as: COZAAR Tome 1 tableta (50 mg en total) por va oral diariamente. (Take 1 tablet (50 mg total) by mouth daily.)   melatonin 3 MG Tabs tablet Take 3 mg by mouth at bedtime.   rosuvastatin 40 MG tablet Commonly known  as: CRESTOR Tome 1 tableta (40 mg en total) por va oral diariamente. (Take 1 tablet (40 mg total) by mouth daily.)               Durable Medical Equipment  (From admission, onward)           Start     Ordered   11/14/23 0903  For home use only DME Bedside commode  Once       Question:  Patient needs a bedside commode to treat with the following condition  Answer:  CVA (cerebral vascular accident) (HCC)   11/14/23 0902   11/14/23 0902  For home use only DME Walker rolling  Once       Question Answer Comment  Walker: With 5 Inch Wheels   Patient needs a walker to treat with the following condition CVA (cerebral vascular accident) (HCC)      11/14/23 0902            Follow-up Information     [facility] Follow up.   Specialty: Home Health Services Why: Danelle Earthly will contact  you within 48 hours of discharge to arrange a time for home therapy Contact information: [address] [place] [address] [phone]         [employer] Follow up.   Why: [employer] is providing the bedside commode and rolling walker Contact information: [address]  [address]  [place] [address]  [phone]        Saguier, Ramon Dredge, [place]. Schedule an appointment as soon as possible for a visit in 1 week(s).   Specialties: Internal Medicine, Family Medicine Contact information: [address] [place] [address] [phone]         [facility] Follow up.   Specialty: Neurology Why: Office will call with date/time, If you dont hear from them,please give them a call Contact information: [address] [place] [address] [phone]               Allergies  Allergen Reactions   Aspirin Swelling and Other (See Comments)    Facial swelling     Other Procedures/Studies: MR BRAIN WO CONTRAST Result Date: 11/12/2023 CLINICAL DATA:  Neuro deficit with acute stroke  suspected. Irritated with difficult gait EXAM: MRI HEAD WITHOUT CONTRAST TECHNIQUE: Multiplanar, multiecho pulse sequences of the brain and surrounding structures were obtained without intravenous contrast. COMPARISON:  Head CT from earlier today FINDINGS: Brain: Small acute infarct in the posterior left pons, at the floor of the fourth ventricle. There is a background of severe chronic small vessel ischemia with extensive gliosis in the cerebral white matter, deep gray nuclei, and pons. Multiple chronic infarcts in the pons and deep gray nuclei/deep white matter. Innumerable microhemorrhages primarily in the deep brain and correlating with a hypertensive encephalopathy pattern. No acute hemorrhage, hydrocephalus, or masslike finding. Vascular: Major flow voids are preserved Skull and upper cervical spine: Normal marrow signal Sinuses/Orbits: Negative IMPRESSION: 1. Punctate acute infarct in the posterior left pons along the floor of the fourth ventricle. 2. Severe chronic small vessel ischemia. Electronically Signed   By: Tiburcio Pea M.D.   On: 11/12/2023 18:47   CT HEAD WO CONTRAST Result Date: 11/12/2023 CLINICAL DATA:  Provided history: Neuro deficit, acute, stroke suspected. Additional history provided: headache, confusion. EXAM: CT HEAD WITHOUT CONTRAST TECHNIQUE: Contiguous axial images were obtained from the base of the skull through the vertex without intravenous contrast. RADIATION DOSE REDUCTION: This exam was performed according to the departmental dose-optimization program which includes automated exposure control, adjustment of the mA and/or kV according to patient size and/or use of iterative reconstruction technique. COMPARISON:  Head CT 10/29/2023. FINDINGS: Brain: Generalized cerebral atrophy. Chronic lacunar infarcts again noted within the left subinsular white matter and central pons. Advanced patchy and ill-defined hypoattenuation elsewhere within the cerebral white matter and bilateral  thalami, nonspecific but likely reflecting chronic small vessel ischemic disease given the patient's history of hypertension, hyperlipidemia and stroke. There is no acute intracranial hemorrhage. No demarcated cortical infarct. No extra-axial fluid collection. No evidence of an intracranial mass. No midline shift. Vascular: No hyperdense vessel.  Atherosclerotic calcifications. Skull: No calvarial fracture or aggressive osseous lesion. Sinuses/Orbits: No mass or acute finding within the imaged orbits. Mild mucosal thickening within the right frontal, bilateral ethmoid and right sphenoid sinuses. IMPRESSION: 1.  No evidence of an acute intracranial abnormality. 2. Chronic lacunar infarcts again noted within the left subinsular white matter and central pons. 3. Advanced patchy and ill-defined hypoattenuation elsewhere within the cerebral white matter and  thalami, nonspecific but likely reflecting chronic small vessel ischemic disease (given the patient's history of hypertension, hyperlipidemia and stroke). 4. Generalized cerebral atrophy. 5. Paranasal sinus disease at the imaged levels, as described. Electronically Signed   By: Jackey Loge D.O.   On: 11/12/2023 15:49   CT HEAD WO CONTRAST ( ) Result Date: 10/29/2023 CLINICAL DATA:  Provided history: History of CVA in adulthood. Memory deficits. Slurred speech. Mental status change, unknown cause. History of stroke. Severe memory changes. Slurred speech, worsening. EXAM: CT HEAD WITHOUT CONTRAST TECHNIQUE: Contiguous axial images were obtained from the base of the skull through the vertex without intravenous contrast. RADIATION DOSE REDUCTION: This exam was performed according to the departmental dose-optimization program which includes automated exposure control, adjustment of the mA and/or kV according to patient size and/or use of iterative reconstruction technique. COMPARISON:  Head CT 01/13/2022. FINDINGS: Brain: Generalized cerebral atrophy. Chronic  lacunar infarcts again noted within the left subinsular white matter and central pons. Background advanced patchy and ill-defined hypoattenuation within the cerebral white matter and bilateral thalami, nonspecific but likely reflecting chronic small vessel ischemic disease (given the history of hypertension, hyperlipidemia and stroke). There is no acute intracranial hemorrhage. No demarcated cortical infarct. No extra-axial fluid collection. No evidence of an intracranial mass. No midline shift. Vascular: No hyperdense vessel.  Atherosclerotic calcifications. Skull: No calvarial fracture or aggressive osseous lesion. Sinuses/Orbits: No mass or acute finding within the imaged orbits. Trace mucosal thickening within the right frontal, right ethmoid and right maxillary sinuses at the imaged levels. IMPRESSION: 1.  No evidence of an acute intracranial abnormality. 2. Chronic lacunar infarcts again noted within the left subinsular white matter and central pons. 3. Background advanced hypoattenuation changes within the cerebral white matter and thalami, nonspecific but likely reflecting sequelae of chronic small vessel ischemic disease (given the history of hypertension, hyperlipidemia and stroke). 4. Generalized cerebral atrophy. Electronically Signed   By: Jackey Loge D.O.   On: 10/29/2023 14:35     TODAY-DAY OF DISCHARGE:  Subjective:   [place] Mishra today has no headache,no chest abdominal pain,no new weakness tingling or numbness, feels much better wants to go home today.   Objective:   Blood pressure 110/82, pulse 78, temperature 97.7 F (36.5 C), temperature source Oral, resp. rate 14, height 5\' 6"  (1.676 m), weight 68.3 kg, SpO2 90%. No intake or output data in the 24 hours ending 11/14/23 1254 Filed Weights   11/12/23 1427  Weight: 68.3 kg    Exam: Awake Alert, Oriented *3, No new F.N deficits, Normal affect Crawford.AT,PERRAL Supple Neck,No JVD, No cervical lymphadenopathy appriciated.   Symmetrical Chest wall movement, Good air movement bilaterally, CTAB RRR,No Gallops,Rubs or new Murmurs, No Parasternal Heave +ve B.Sounds, Abd Soft, Non tender, No organomegaly appriciated, No rebound -guarding or rigidity. No Cyanosis, Clubbing or edema, No new Rash or bruise   PERTINENT RADIOLOGIC STUDIES: MR BRAIN WO CONTRAST Result Date: 11/12/2023 CLINICAL DATA:  Neuro deficit with acute stroke suspected. Irritated with difficult gait EXAM: MRI HEAD WITHOUT CONTRAST TECHNIQUE: Multiplanar, multiecho pulse sequences of the brain and surrounding structures were obtained without intravenous contrast. COMPARISON:  Head CT from earlier today FINDINGS: Brain: Small acute infarct in the posterior left pons, at the floor of the fourth ventricle. There is a background of severe chronic small vessel ischemia with extensive gliosis in the cerebral white matter, deep gray nuclei, and pons. Multiple chronic infarcts in the pons and deep gray nuclei/deep white matter. Innumerable microhemorrhages primarily in the deep brain  and correlating with a hypertensive encephalopathy pattern. No acute hemorrhage, hydrocephalus, or masslike finding. Vascular: Major flow voids are preserved Skull and upper cervical spine: Normal marrow signal Sinuses/Orbits: Negative IMPRESSION: 1. Punctate acute infarct in the posterior left pons along the floor of the fourth ventricle. 2. Severe chronic small vessel ischemia. Electronically Signed   By: Tiburcio Pea M.D.   On: 11/12/2023 18:47   CT HEAD WO CONTRAST Result Date: 11/12/2023 CLINICAL DATA:  Provided history: Neuro deficit, acute, stroke suspected. Additional history provided: headache, confusion. EXAM: CT HEAD WITHOUT CONTRAST TECHNIQUE: Contiguous axial images were obtained from the base of the skull through the vertex without intravenous contrast. RADIATION DOSE REDUCTION: This exam was performed according to the departmental dose-optimization program which includes  automated exposure control, adjustment of the mA and/or kV according to patient size and/or use of iterative reconstruction technique. COMPARISON:  Head CT 10/29/2023. FINDINGS: Brain: Generalized cerebral atrophy. Chronic lacunar infarcts again noted within the left subinsular white matter and central pons. Advanced patchy and ill-defined hypoattenuation elsewhere within the cerebral white matter and bilateral thalami, nonspecific but likely reflecting chronic small vessel ischemic disease given the patient's history of hypertension, hyperlipidemia and stroke. There is no acute intracranial hemorrhage. No demarcated cortical infarct. No extra-axial fluid collection. No evidence of an intracranial mass. No midline shift. Vascular: No hyperdense vessel.  Atherosclerotic calcifications. Skull: No calvarial fracture or aggressive osseous lesion. Sinuses/Orbits: No mass or acute finding within the imaged orbits. Mild mucosal thickening within the right frontal, bilateral ethmoid and right sphenoid sinuses. IMPRESSION: 1.  No evidence of an acute intracranial abnormality. 2. Chronic lacunar infarcts again noted within the left subinsular white matter and central pons. 3. Advanced patchy and ill-defined hypoattenuation elsewhere within the cerebral white matter and thalami, nonspecific but likely reflecting chronic small vessel ischemic disease (given the patient's history of hypertension, hyperlipidemia and stroke). 4. Generalized cerebral atrophy. 5. Paranasal sinus disease at the imaged levels, as described. Electronically Signed   By: Jackey Loge D.O.   On: 11/12/2023 15:49     PERTINENT LAB RESULTS: CBC: Recent Labs    11/12/23 1504 11/13/23 1818  WBC 8.2 8.4  HGB 12.8* 14.1  HCT 37.7* 41.5  PLT 260 290   CMET CMP     Component Value Date/Time   NA 134 (L) 11/12/2023 1504   K 3.6 11/12/2023 1504   CL 102 11/12/2023 1504   CO2 24 11/12/2023 1504   GLUCOSE 224 (H) 11/12/2023 1504   BUN 18  11/12/2023 1504   CREATININE 1.11 11/13/2023 1818   CALCIUM 8.9 11/12/2023 1504   PROT 7.2 11/12/2023 1504   ALBUMIN 4.0 11/12/2023 1504   AST 16 11/12/2023 1504   ALT 16 11/12/2023 1504   ALKPHOS 65 11/12/2023 1504   BILITOT 0.7 11/12/2023 1504   GFR 76.12 09/24/2023 1136   GFRNONAA >60 11/13/2023 1818    GFR Estimated Creatinine Clearance: 59.9 mL/min (by C-G formula based on SCr of 1.11 mg/dL). No results for input(s): "LIPASE", "AMYLASE" in the last 72 hours. No results for input(s): "CKTOTAL", "CKMB", "CKMBINDEX", "TROPONINI" in the last 72 hours. Invalid input(s): "POCBNP" No results for input(s): "DDIMER" in the last 72 hours. Recent Labs    11/13/23 1818  HGBA1C 5.2   Recent Labs    11/14/23 0515  CHOL 154  HDL 55  LDLCALC 89  TRIG 51  CHOLHDL 2.8   No results for input(s): "TSH", "T4TOTAL", "T3FREE", "THYROIDAB" in the last 72 hours.  Invalid  input(s): "FREET3" No results for input(s): "VITAMINB12", "FOLATE", "FERRITIN", "TIBC", "IRON", "RETICCTPCT" in the last 72 hours. Coags: Recent Labs    11/12/23 1504  INR 1.0   Microbiology: No results found for this or any previous visit (from the past 240 hours).  FURTHER DISCHARGE INSTRUCTIONS:  Get Medicines reviewed and adjusted: Please take all your medications with you for your next visit with your Primary MD  Laboratory/radiological data: Please request your Primary MD to go over all hospital tests and procedure/radiological results at the follow up, please ask your Primary MD to get all Hospital records sent to his/her office.  In some cases, they will be blood work, cultures and biopsy results pending at the time of your discharge. Please request that your primary care M.D. goes through all the records of your hospital data and follows up on these results.  Also Note the following: If you experience worsening of your admission symptoms, develop shortness of breath, life threatening emergency, suicidal  or homicidal thoughts you must seek medical attention immediately by calling 911 or calling your MD immediately  if symptoms less severe.  You must read complete instructions/literature along with all the possible adverse reactions/side effects for all the Medicines you take and that have been prescribed to you. Take any new Medicines after you have completely understood and accpet all the possible adverse reactions/side effects.   Do not drive when taking Pain medications or sleeping medications (Benzodaizepines)  Do not take more than prescribed Pain, Sleep and Anxiety Medications. It is not advisable to combine anxiety,sleep and pain medications without talking with your primary care practitioner  Special Instructions: If you have smoked or chewed Tobacco  in the last 2 yrs please stop smoking, stop any regular Alcohol  and or any Recreational drug use.  Wear Seat belts while driving.  Please note: You were cared for by a hospitalist during your hospital stay. Once you are discharged, your primary care physician will handle any further medical issues. Please note that NO REFILLS for any discharge medications will be authorized once you are discharged, as it is imperative that you return to your primary care physician (or establish a relationship with a primary care physician if you do not have one) for your post hospital discharge needs so that they can reassess your need for medications and monitor your lab values.  Total Time spent coordinating discharge including counseling, education and face to face time equals greater than 30 minutes.  SignedJeoffrey Massed 11/14/2023 12:54 PM

## 2023-11-14 NOTE — TOC Transition Note (Signed)
Transition of Care [facility]) - Discharge Note   Patient Details  Name: Cory Romero MRN: [id number] Date of Birth: 1956-10-12  [facility]) CM/SW Contact:  Gordy Clement, RN Phone Number: 11/14/2023, 4:44 PM   Clinical Narrative:      BSC ordered in place of. [employer] to deliver DME bedside.  Home Health will be provided by [facility] AVS updated             Patient Goals and CMS Choice            Discharge Placement                       Discharge Plan and Services Additional resources added to the After Visit Summary for                                       Social Drivers of Health (SDOH) Interventions SDOH Screenings   Food Insecurity: No Food Insecurity (11/13/2023)  Housing: Low Risk  (11/13/2023)  Transportation Needs: No Transportation Needs (11/13/2023)  Utilities: Not At Risk (11/13/2023)  Tobacco Use: Medium Risk (11/12/2023)     Readmission Risk Interventions     No data to display

## 2023-11-14 NOTE — Progress Notes (Signed)
STROKE TEAM PROGRESS NOTE   SUBJECTIVE (INTERVAL HISTORY) His wife is at the bedside.  Spanish interpreter used for this encounter.  Overall his condition is gradually improving.  Per wife, patient had a stroke 2 years ago, since then patient memory and cognition declined.  This time started from Sunday patient had headache, difficulty walking with falling, more confused than normal.   OBJECTIVE Temp:  [97.7 F (36.5 C)-98.2 F (36.8 C)] 97.9 F (36.6 C) (12/13 1610) Pulse Rate:  [75-98] 98 (12/13 1610) Cardiac Rhythm: Normal sinus rhythm (12/13 0900) Resp:  [14-20] 20 (12/13 1610) BP: (110-169)/(82-85) 169/85 (12/13 1610) SpO2:  [90 %-92 %] 92 % (12/13 1610)  No results for input(s): "GLUCAP" in the last 168 hours. Recent Labs  Lab 11/12/23 1504 11/13/23 1818  NA 134*  --   K 3.6  --   CL 102  --   CO2 24  --   GLUCOSE 224*  --   BUN 18  --   CREATININE 0.95 1.11  CALCIUM 8.9  --    Recent Labs  Lab 11/12/23 1504  AST 16  ALT 16  ALKPHOS 65  BILITOT 0.7  PROT 7.2  ALBUMIN 4.0   Recent Labs  Lab 11/12/23 1504 11/13/23 1818  WBC 8.2 8.4  NEUTROABS 6.3  --   HGB 12.8* 14.1  HCT 37.7* 41.5  MCV 94.0 94.3  PLT 260 290   No results for input(s): "CKTOTAL", "CKMB", "CKMBINDEX", "TROPONINI" in the last 168 hours. Recent Labs    11/12/23 1504  LABPROT 13.4  INR 1.0   Recent Labs    11/12/23 1921  COLORURINE YELLOW  LABSPEC 1.010  PHURINE 6.0  GLUCOSEU NEGATIVE  HGBUR NEGATIVE  BILIRUBINUR NEGATIVE  KETONESUR NEGATIVE  PROTEINUR NEGATIVE  NITRITE NEGATIVE  LEUKOCYTESUR NEGATIVE       Component Value Date/Time   CHOL 154 11/14/2023 0515   TRIG 51 11/14/2023 0515   HDL 55 11/14/2023 0515   CHOLHDL 2.8 11/14/2023 0515   VLDL 10 11/14/2023 0515   LDLCALC 89 11/14/2023 0515   Lab Results  Component Value Date   HGBA1C 5.2 11/13/2023      Component Value Date/Time   LABOPIA NONE DETECTED 11/12/2023 1921   COCAINSCRNUR NONE DETECTED 11/12/2023  1921   LABBENZ NONE DETECTED 11/12/2023 1921   AMPHETMU NONE DETECTED 11/12/2023 1921   THCU NONE DETECTED 11/12/2023 1921   LABBARB NONE DETECTED 11/12/2023 1921    Recent Labs  Lab 11/12/23 1504  ETH <10    I have personally reviewed the radiological images below and agree with the radiology interpretations.  CT ANGIO HEAD NECK W WO CM Result Date: 11/14/2023 CLINICAL DATA:  Stroke/TIA, determine embolic source. EXAM: CT ANGIOGRAPHY HEAD AND NECK WITH AND WITHOUT CONTRAST TECHNIQUE: Multidetector CT imaging of the head and neck was performed using the standard protocol during bolus administration of intravenous contrast. Multiplanar CT image reconstructions and MIPs were obtained to evaluate the vascular anatomy. Carotid stenosis measurements (when applicable) are obtained utilizing NASCET criteria, using the distal internal carotid diameter as the denominator. RADIATION DOSE REDUCTION: This exam was performed according to the departmental dose-optimization program which includes automated exposure control, adjustment of the mA and/or kV according to patient size and/or use of iterative reconstruction technique. CONTRAST:  75mL OMNIPAQUE IOHEXOL 350 MG/ML SOLN COMPARISON:  Head CT 11/12/2023.  MRI brain 11/12/2023. FINDINGS: CT HEAD FINDINGS Brain: No acute hemorrhage. Stable severe chronic small-vessel disease. The recent punctate infarct in the pons seen  on brain MRI is not resolved by CT. No hydrocephalus or extra-axial collection. No mass effect or midline shift. Vascular: No hyperdense vessel or unexpected calcification. Skull: No calvarial fracture or suspicious bone lesion. Skull base is unremarkable. Sinuses/Orbits: No acute finding. Other: None. Review of the MIP images confirms the above findings CTA NECK FINDINGS Aortic arch: Three-vessel arch configuration. Atherosclerotic calcifications of the aortic arch and arch vessel origins. Arch vessel origins are patent. Right carotid system: No  evidence of dissection, stenosis (50% or greater), or occlusion. Moderate calcified plaque at the right carotid bulb. Left carotid system: No evidence of dissection, stenosis (50% or greater), or occlusion. Moderate calcified plaque along the left carotid bulb. Vertebral arteries: Codominant. No evidence of dissection, stenosis (50% or greater), or occlusion. Skeleton: Mild cervical spondylosis without high-grade spinal canal stenosis. Other neck: Unremarkable. Upper chest: Unremarkable. Review of the MIP images confirms the above findings CTA HEAD FINDINGS Anterior circulation: Calcified plaque along the carotid siphons without hemodynamically significant stenosis. The proximal ACAs and MCAs are patent without stenosis or aneurysm. Distal branches are symmetric. Posterior circulation: Normal basilar artery. The SCAs, AICAs and PICAs are patent proximally. Moderate stenosis of the left PCA, proximal P2 segment. The right PCA is patent proximally without stenosis or aneurysm. Distal branches are symmetric. Venous sinuses: Early phase of contrast. Anatomic variants: Persistent fetal origin of the right PCA with hypoplastic right P1 segment. Review of the MIP images confirms the above findings IMPRESSION: 1. No acute intracranial hemorrhage. The recent punctate infarct in the pons seen on brain MRI is not resolved by CT. 2. No large vessel occlusion. Moderate stenosis of the left PCA, proximal P2 segment. 3. No hemodynamically significant stenosis in the neck. Aortic Atherosclerosis (ICD10-I70.0). Electronically Signed   By: Orvan Falconer M.D.   On: 11/14/2023 16:28   ECHOCARDIOGRAM COMPLETE Result Date: 11/14/2023    ECHOCARDIOGRAM REPORT   Patient Name:   [facility] Date of Exam: 11/14/2023 Medical Rec #:  [id number]                 Height:       66.0 in Accession #:    [id number]                Weight:       150.6 lb Date of Birth:  03-Mar-1956                  BSA:          1.773 m Patient Age:     67 years                  BP:           110/82 mmHg Patient Gender: M                         HR:           92 bpm. Exam Location:  Inpatient Procedure: 2D Echo, 3D Echo, Cardiac Doppler, Color Doppler and Strain Analysis Indications:    Stroke  History:        Patient has no prior history of Echocardiogram examinations.                 Risk Factors:Hypertension and Dyslipidemia.  Sonographer:    Karma Ganja Referring Phys: Hughie Closs  Sonographer Comments: Global longitudinal strain was attempted. IMPRESSIONS  1. Left ventricular ejection fraction, by estimation, is 65 to 70%. The  left ventricle has normal function. The left ventricle has no regional wall motion abnormalities. There is severe asymmetric left ventricular hypertrophy of the septal segment. Indeterminate diastolic filling due to E-A fusion. No LVOT obstruction or SAM noted. Basal septum measures 19 mm, consider cardiac MRI to further evaluate for severely increased LV wall thickness.  2. Right ventricular systolic function is normal. The right ventricular size is normal. Tricuspid regurgitation signal is inadequate for assessing PA pressure.  3. The mitral valve is grossly normal. Trivial mitral valve regurgitation. No evidence of mitral stenosis.  4. The aortic valve is grossly normal and appears tricuspid. Aortic valve regurgitation is mild to moderate and is eccentric and may be underestimated. No aortic stenosis is present.  5. Aortic dilatation noted. Aneurysm of the aortic root. There is moderate dilatation of the aortic root, measuring 45 mm.  6. The inferior vena cava is normal in size with greater than 50% respiratory variability, suggesting right atrial pressure of 3 mmHg. Conclusion(s)/Recommendation(s): No intracardiac source of embolism detected on this transthoracic study. Consider a transesophageal echocardiogram to exclude cardiac source of embolism if clinically indicated. FINDINGS  Left Ventricle: Left ventricular ejection  fraction, by estimation, is 65 to 70%. The left ventricle has normal function. The left ventricle has no regional wall motion abnormalities. Global longitudinal strain performed but not reported based on interpreter judgement due to suboptimal tracking. 3D ejection fraction reviewed and evaluated as part of the interpretation. Alternate measurement of EF is felt to be most reflective of LV function. The left ventricular internal cavity size was normal in  size. There is severe asymmetric left ventricular hypertrophy of the septal segment. Indeterminate diastolic filling due to E-A fusion. Right Ventricle: The right ventricular size is normal. No increase in right ventricular wall thickness. Right ventricular systolic function is normal. Tricuspid regurgitation signal is inadequate for assessing PA pressure. Left Atrium: Left atrial size was normal in size. Right Atrium: Right atrial size was normal in size. Pericardium: There is no evidence of pericardial effusion. Mitral Valve: The mitral valve is grossly normal. Trivial mitral valve regurgitation. No evidence of mitral valve stenosis. Tricuspid Valve: The tricuspid valve is normal in structure. Tricuspid valve regurgitation is trivial. No evidence of tricuspid stenosis. Aortic Valve: The aortic valve is grossly normal. Aortic valve regurgitation is mild to moderate. No aortic stenosis is present. Aortic valve mean gradient measures 3.0 mmHg. Aortic valve peak gradient measures 5.8 mmHg. Aortic valve area, by VTI measures 3.37 cm. Pulmonic Valve: The pulmonic valve was not well visualized. Pulmonic valve regurgitation is trivial. No evidence of pulmonic stenosis. Aorta: Aortic dilatation noted. There is moderate dilatation of the aortic root, measuring 45 mm. There is an aneurysm involving the aortic root. Venous: The inferior vena cava is normal in size with greater than 50% respiratory variability, suggesting right atrial pressure of 3 mmHg. IAS/Shunts: No  atrial level shunt detected by color flow Doppler.  LEFT VENTRICLE PLAX 2D LVIDd:         3.90 cm   Diastology LVIDs:         2.20 cm   LV e' medial:    6.64 cm/s LV PW:         1.30 cm   LV E/e' medial:  6.2 LV IVS:        1.90 cm   LV e' lateral:   7.72 cm/s LVOT diam:     2.10 cm   LV E/e' lateral: 5.4 LV SV:  60 LV SV Index:   34 LVOT Area:     3.46 cm                           3D Volume EF:                          3D EF:        52 %                          LV EDV:       106 ml                          LV ESV:       51 ml                          LV SV:        55 ml RIGHT VENTRICLE             IVC RV Basal diam:  2.40 cm     IVC diam: 0.90 cm RV S prime:     13.50 cm/s TAPSE (M-mode): 1.8 cm LEFT ATRIUM             Index        RIGHT ATRIUM          Index LA diam:        2.70 cm 1.52 cm/m   RA Area:     7.19 cm LA Vol (A2C):   47.7 ml 26.91 ml/m  RA Volume:   9.26 ml  5.22 ml/m LA Vol (A4C):   31.8 ml 17.94 ml/m LA Biplane Vol: 40.0 ml 22.57 ml/m  AORTIC VALVE AV Area (Vmax):    2.97 cm AV Area (Vmean):   2.88 cm AV Area (VTI):     3.37 cm AV Vmax:           120.00 cm/s AV Vmean:          85.600 cm/s AV VTI:            0.178 m AV Peak Grad:      5.8 mmHg AV Mean Grad:      3.0 mmHg LVOT Vmax:         103.00 cm/s LVOT Vmean:        71.300 cm/s LVOT VTI:          0.173 m LVOT/AV VTI ratio: 0.97  AORTA Ao Root diam: 4.50 cm Ao Asc diam:  3.60 cm MITRAL VALVE MV Area (PHT): 5.97 cm    SHUNTS MV Decel Time: 127 msec    Systemic VTI:  0.17 m MV E velocity: 41.40 cm/s  Systemic Diam: 2.10 cm MV A velocity: 69.20 cm/s MV E/A ratio:  0.60 Weston Brass MD Electronically signed by Weston Brass MD Signature Date/Time: 11/14/2023/3:55:27 PM    Final    MR BRAIN WO CONTRAST Result Date: 11/12/2023 CLINICAL DATA:  Neuro deficit with acute stroke suspected. Irritated with difficult gait EXAM: MRI HEAD WITHOUT CONTRAST TECHNIQUE: Multiplanar, multiecho pulse sequences of the brain and surrounding  structures were obtained without intravenous contrast. COMPARISON:  Head CT from earlier today FINDINGS: Brain: Small acute infarct in the posterior left pons, at the floor of the fourth ventricle. There is a background of severe chronic small  vessel ischemia with extensive gliosis in the cerebral white matter, deep gray nuclei, and pons. Multiple chronic infarcts in the pons and deep gray nuclei/deep white matter. Innumerable microhemorrhages primarily in the deep brain and correlating with a hypertensive encephalopathy pattern. No acute hemorrhage, hydrocephalus, or masslike finding. Vascular: Major flow voids are preserved Skull and upper cervical spine: Normal marrow signal Sinuses/Orbits: Negative IMPRESSION: 1. Punctate acute infarct in the posterior left pons along the floor of the fourth ventricle. 2. Severe chronic small vessel ischemia. Electronically Signed   By: Tiburcio Pea M.D.   On: 11/12/2023 18:47   CT HEAD WO CONTRAST Result Date: 11/12/2023 CLINICAL DATA:  Provided history: Neuro deficit, acute, stroke suspected. Additional history provided: headache, confusion. EXAM: CT HEAD WITHOUT CONTRAST TECHNIQUE: Contiguous axial images were obtained from the base of the skull through the vertex without intravenous contrast. RADIATION DOSE REDUCTION: This exam was performed according to the departmental dose-optimization program which includes automated exposure control, adjustment of the mA and/or kV according to patient size and/or use of iterative reconstruction technique. COMPARISON:  Head CT 10/29/2023. FINDINGS: Brain: Generalized cerebral atrophy. Chronic lacunar infarcts again noted within the left subinsular white matter and central pons. Advanced patchy and ill-defined hypoattenuation elsewhere within the cerebral white matter and bilateral thalami, nonspecific but likely reflecting chronic small vessel ischemic disease given the patient's history of hypertension, hyperlipidemia and stroke.  There is no acute intracranial hemorrhage. No demarcated cortical infarct. No extra-axial fluid collection. No evidence of an intracranial mass. No midline shift. Vascular: No hyperdense vessel.  Atherosclerotic calcifications. Skull: No calvarial fracture or aggressive osseous lesion. Sinuses/Orbits: No mass or acute finding within the imaged orbits. Mild mucosal thickening within the right frontal, bilateral ethmoid and right sphenoid sinuses. IMPRESSION: 1.  No evidence of an acute intracranial abnormality. 2. Chronic lacunar infarcts again noted within the left subinsular white matter and central pons. 3. Advanced patchy and ill-defined hypoattenuation elsewhere within the cerebral white matter and thalami, nonspecific but likely reflecting chronic small vessel ischemic disease (given the patient's history of hypertension, hyperlipidemia and stroke). 4. Generalized cerebral atrophy. 5. Paranasal sinus disease at the imaged levels, as described. Electronically Signed   By: Jackey Loge D.O.   On: 11/12/2023 15:49   CT HEAD WO CONTRAST ( ) Result Date: 10/29/2023 CLINICAL DATA:  Provided history: History of CVA in adulthood. Memory deficits. Slurred speech. Mental status change, unknown cause. History of stroke. Severe memory changes. Slurred speech, worsening. EXAM: CT HEAD WITHOUT CONTRAST TECHNIQUE: Contiguous axial images were obtained from the base of the skull through the vertex without intravenous contrast. RADIATION DOSE REDUCTION: This exam was performed according to the departmental dose-optimization program which includes automated exposure control, adjustment of the mA and/or kV according to patient size and/or use of iterative reconstruction technique. COMPARISON:  Head CT 01/13/2022. FINDINGS: Brain: Generalized cerebral atrophy. Chronic lacunar infarcts again noted within the left subinsular white matter and central pons. Background advanced patchy and ill-defined hypoattenuation within the  cerebral white matter and bilateral thalami, nonspecific but likely reflecting chronic small vessel ischemic disease (given the history of hypertension, hyperlipidemia and stroke). There is no acute intracranial hemorrhage. No demarcated cortical infarct. No extra-axial fluid collection. No evidence of an intracranial mass. No midline shift. Vascular: No hyperdense vessel.  Atherosclerotic calcifications. Skull: No calvarial fracture or aggressive osseous lesion. Sinuses/Orbits: No mass or acute finding within the imaged orbits. Trace mucosal thickening within the right frontal, right ethmoid and right maxillary sinuses at the imaged  levels. IMPRESSION: 1.  No evidence of an acute intracranial abnormality. 2. Chronic lacunar infarcts again noted within the left subinsular white matter and central pons. 3. Background advanced hypoattenuation changes within the cerebral white matter and thalami, nonspecific but likely reflecting sequelae of chronic small vessel ischemic disease (given the history of hypertension, hyperlipidemia and stroke). 4. Generalized cerebral atrophy. Electronically Signed   By: Jackey Loge D.O.   On: 10/29/2023 14:35     PHYSICAL EXAM  Temp:  [97.7 F (36.5 C)-98.2 F (36.8 C)] 97.9 F (36.6 C) (12/13 1610) Pulse Rate:  [75-98] 98 (12/13 1610) Resp:  [14-20] 20 (12/13 1610) BP: (110-169)/(82-85) 169/85 (12/13 1610) SpO2:  [90 %-92 %] 92 % (12/13 1610)  General - Well nourished, well developed, in no apparent distress.  Ophthalmologic - fundi not visualized due to noncooperation.  Cardiovascular - Regular rhythm and rate.  Neuro - awake, alert, eyes open, not orientated to age, place, time but orientated to wife. No aphasia, paucity of speech, able to follow most simple commands.  Moderate to severe dysarthria.  Not able to name but able to repeat simple sentences. No gaze palsy, tracking bilaterally, visual field full. No significant facial droop. Tongue midline. Bilateral  UEs 5/5, no drift. Bilaterally LEs 5/5, no drift. Sensation exam not quite cooperative but seems symmetrical bilaterally subjectively, b/l FTN intact but slow, gait not tested.     ASSESSMENT/PLAN Mr. Victormanuel Mallard Mazanec is a 67 y.o. male with history of hyperlipidemia, stroke in 11/2021 admitted for headache, difficulty walking, falling, confusion. No tPA given due to outside window.    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  Advanced small vessel ischemic disease CT head and neck no large vessel occlusion, moderate stenosis left P2 MRI left dorsal pontine small infarct. Multiple chronic infarcts in the pons and deep gray nuclei/deep white matter. Innumerable microhemorrhages primarily in the deep brain and correlating with a hypertensive encephalopathy pattern. 2D Echo EF 65 to 70% LDL 89 HgbA1c 5.2 UDS negative Lovenox for VTE prophylaxis No antithrombotic prior to admission, now on aspirin 81 mg daily and clopidogrel 75 mg daily DAPT for 3 weeks and then aspirin alone. Ongoing aggressive stroke risk factor management Therapy recommendations: Home health PT and OT Disposition: Home today  Vascular dementia Decreased cognition since last stroke Neuroimaging showed advanced small vessel ischemic disease, numerous microhemorrhages Outpatient neurology follow-up  Hypertension Stable On Coreg and Norvasc Home meds Home BP monitoring Headache at home likely related to uncontrolled hypertension and in numerous microhemorrhages Long term BP goal normotensive  Hyperlipidemia Home meds: Crestor 40 LDL 89, goal < 70 Now on Crestor 40 Continue statin at discharge  Other Stroke Risk Factors Advanced age Hx stroke/TIA in 11/2021, details not clear but with residual dysarthria and cognitive impairment  Other Active Problems   Hospital  day # 1  Neurology will sign off. Please call with questions. Pt will follow up with stroke clinic NP at [facility] in about 4 weeks. Thanks for the consult.   Marvel Plan, MD PhD Stroke Neurology 11/14/2023 7:20 PM    To contact Stroke Continuity provider, please refer to [email or web address]. After hours, contact General Neurology

## 2023-11-14 NOTE — Evaluation (Signed)
Occupational Therapy Evaluation Patient Details Name: Cory Romero MRN: [id number] DOB: 01/18/1956 Today's Date: 11/14/2023   History of Present Illness Cory Romero is a 67 y.o. male who presented to ED with dragging his right foot on Sunday and complaining of intermittent headache--diagnosed with left pontine acute ischemic stroke. 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 In that time he has also exhibited more of a child-like demeanor.   Clinical Impression   This 67 yo male admitted with above presents to acute OT with PLOF of being off balance and with decreased memory, but able to do all of his own self care and ambulated without an AD. He currently is setup/S-Mod A for basic ADLs and Mod A to ambulate in room without AD and not holding onto furniture. He will continue to benefit from acute OT with follow up HHOT. Video interpreter used throughout session.      If plan is discharge home, recommend the following: A lot of help with walking and/or transfers;A lot of help with bathing/dressing/bathroom;Assistance with cooking/housework;Help with stairs or ramp for entrance;Assist for transportation;Direct supervision/assist for financial management;Direct supervision/assist for medications management    Functional Status Assessment  Patient has had a recent decline in their functional status and demonstrates the ability to make significant improvements in function in a reasonable and predictable amount of time.  Equipment Recommendations  BSC/3in1       Precautions / Restrictions Precautions Precautions: Fall Restrictions Weight  Bearing Restrictions Per Provider Order: No      Mobility Bed Mobility Overal bed mobility: Needs Assistance Bed Mobility: Supine to Sit     Supine to sit: Contact guard, HOB elevated     General bed mobility comments: increased time and effort    Transfers Overall transfer level: Needs assistance Equipment used: 1 person hand held assist Transfers: Sit to/from Stand Sit to Stand: Mod assist (posterior lean)                  Balance Overall balance assessment: Needs assistance Sitting-balance support: No upper extremity supported, Feet supported Sitting balance-Leahy Scale: Good     Standing balance support: No upper extremity supported Standing balance-Leahy Scale: Poor Standing balance comment: posterior lean with pt wanting to reach for surfaces to gain balance                           ADL either performed or assessed with clinical judgement   ADL Overall ADL's : Needs assistance/impaired Eating/Feeding: Supervision/ safety;Set up Eating/Feeding Details (indicate cue type and reason): sitting EOB Grooming: Minimal assistance;Sitting Grooming Details (indicate cue type and reason): EOB Upper Body Bathing: Moderate assistance;Sitting Upper Body Bathing Details (indicate cue type and reason): EOB Lower Body Bathing: Moderate assistance Lower Body Bathing Details (indicate cue type and reason): Mod A sit<>stand (posterior lean) Upper Body Dressing : Moderate assistance;Sitting Upper Body Dressing Details (indicate cue type and reason): EOB Lower Body Dressing: Moderate assistance Lower Body Dressing Details (indicate cue type and reason): Mod A sit<>stand (posterior lean) Toilet Transfer: Moderate assistance;Ambulation Toilet Transfer Details (indicate cue type and reason): No AD and not letting  pt furniture walk; simulated bed>door>back to sit on EOB Toileting- Clothing Manipulation and Hygiene: Moderate assistance Toileting - Clothing Manipulation  Details (indicate cue type and reason): Mod A sit<>stand (posterior lean)             Vision Baseline Vision/History: 1 Wears glasses (both near and far) Vision Assessment?: Vision impaired- to be further tested in functional context Eye Alignment: Within Functional Limits Ocular Range of Motion: Within Functional Limits Tracking/Visual Pursuits:  (diffculty following testing fro tracking)            Pertinent Vitals/Pain Pain Assessment Pain Assessment: No/denies pain     Extremity/Trunk Assessment Upper Extremity Assessment Upper Extremity Assessment:  (wife reports pt is left handed, pt raised right hand when asked) LUE Deficits / Details: left UE delayed with movement compared to right LUE Coordination: decreased gross motor;decreased fine motor           Communication Communication Communication: Other (comment) (spanish speaking only)   Cognition Arousal: Alert Behavior During Therapy: WFL for tasks assessed/performed Overall Cognitive Status:  (history of cognitive impairments since CVA 2022 but now worse per wife. Wife even reports he had some speech difficulties prior to 2022) Area of Impairment: Orientation, Following commands, Safety/judgement, Memory, Problem solving                 Orientation Level: Disoriented to, Place, Time (knew month but not year, did not know he was at hospital)   Memory: Decreased short-term memory Following Commands: Follows one step commands inconsistently Safety/Judgement: Decreased awareness of deficits, Decreased awareness of safety   Problem Solving: Slow processing, Difficulty sequencing, Requires verbal cues, Requires tactile cues General Comments: perservation on tasks at times                Home Living Family/patient expects to be discharged to:: Private residence Living Arrangements: Spouse/significant other Available Help at Discharge: Family;Available 24 hours/day Type of Home: House Home Access:  Stairs to enter [employer] of Steps: 2 Entrance Stairs-Rails: None Home Layout: One level     Bathroom Shower/Tub: Tub/shower unit;Curtain   Firefighter: Standard     Home Equipment: Gilmer Mor - single point   Additional Comments: Patient is from [place], has been in the [place] for 2 years, only speaks [place].      Prior Functioning/Environment Prior Level of Function : Independent/Modified Independent             Mobility Comments: furniture walks at home          OT Problem List: Decreased strength;Decreased range of motion;Impaired balance (sitting and/or standing);Impaired vision/perception;Decreased coordination;Decreased cognition;Decreased safety awareness;Decreased knowledge of use of DME or AE;Impaired UE functional use      OT Treatment/Interventions: Self-care/ADL training;DME and/or AE instruction;Balance training;Patient/family education    OT Goals(Current goals can be found in the care plan section) Acute Rehab OT Goals Patient Stated Goal: wife wants pt to come home with [facility] services instead of SNF OT Goal Formulation: With patient/family Time For Goal Achievement: 11/28/23 Potential to Achieve Goals: Good  OT Frequency: Min 1X/week       AM-PAC OT "6 Clicks" Daily Activity     Outcome Measure Help from another person eating meals?: A Little Help from another person taking care of personal grooming?: A Little Help from another person toileting, which includes using toliet, bedpan, or urinal?: A Lot Help from another person bathing (including washing, rinsing, drying)?: A Lot Help from another person to put on and taking  off regular upper body clothing?: A Lot Help from another person to put on and taking off regular lower body clothing?: A Lot 6 Click Score: 14   End of Session Equipment Utilized During Treatment: Gait belt Nurse Communication: Mobility status (secure chat)  Activity Tolerance: Patient tolerated  treatment well Patient left: in bed;with call bell/phone within reach;with bed alarm set;with family/visitor present  OT Visit Diagnosis: Unsteadiness on feet (R26.81);Other abnormalities of gait and mobility (R26.89);Muscle weakness (generalized) (M62.81);Other symptoms and signs involving cognitive function;Hemiplegia and hemiparesis;Cognitive communication deficit (R41.841);Low vision, both eyes (H54.2) Symptoms and signs involving cognitive functions: Cerebral infarction (? dementia per wife reprots) Hemiplegia - Right/Left: Left Hemiplegia - dominant/non-dominant: Dominant Hemiplegia - caused by: Cerebral infarction                Time: 4401-0272 OT Time Calculation (min): 50 min Charges:  OT General Charges $OT Visit: 1 Visit OT Evaluation $OT Eval Moderate Complexity: 1 Mod OT Treatments $Self Care/Home Management : 23-37 mins  Lindon Romp OT Acute Rehabilitation Services Office [phone]    Evette Georges 11/14/2023, 10:38 AM

## 2023-11-14 NOTE — TOC Progression Note (Signed)
Transition of Care [facility]) - Progression Note    Patient Details  Name: Cory Romero MRN: [id number] Date of Birth: 05/26/1956  Transition [facility]) CM/SW Contact  Gordy Clement, RN Phone Number: 11/14/2023, 9:04 AM  Clinical Narrative:     Patient being recommended RW and  Tub bench- ( ins. Wont pay for tub bench) BSC ordered in place of. [employer] to deliver DME bedside.  Home Health will be provided by [facility]. AVS updated   TOC will continue to follow patient for any additional discharge needs           Expected Discharge Plan and Services                                               Social Determinants of Health (SDOH) Interventions SDOH Screenings   Food Insecurity: No Food Insecurity (11/13/2023)  Housing: Low Risk  (11/13/2023)  Transportation Needs: No Transportation Needs (11/13/2023)  Utilities: Not At Risk (11/13/2023)  Tobacco Use: Medium Risk (11/12/2023)    Readmission Risk Interventions     No data to display

## 2023-11-14 NOTE — Progress Notes (Signed)
  Echocardiogram 2D Echocardiogram has been performed.  Reinaldo Raddle Giancarlos Berendt 11/14/2023, 10:17 AM

## 2023-11-14 NOTE — Evaluation (Signed)
Physical Therapy Evaluation Patient Details Name: Cory Romero MRN: [id number] DOB: Jan 10, 1956 Today's Date: 11/14/2023  History of Present Illness  Cory Romero is a 67 y.o. male who presented to ED with dragging his right foot on Sunday and complaining of intermittent headache--diagnosed with left pontine acute ischemic stroke. 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 In that time he has also exhibited more of a child-like demeanor.  Clinical Impression  Pt presents with admitting diagnosis above. Pt today was able to ambulate in hallway with RW CGA however was slightly impulsive and required constant cues for safety, proximity to RW, and obstacle navigation. PTA pt wife reports that he was mostly furniture walking or needed assist from 2 people. Recommend HHPT with RW and BSC. PT will continue to follow.         If plan is discharge home, recommend the following: A little help with walking and/or transfers;A little help with bathing/dressing/bathroom;Assistance with cooking/housework;Direct supervision/assist for medications management;Assist for transportation;Help with stairs or ramp for entrance;Supervision due to cognitive status;Direct supervision/assist for financial management   Can travel by private vehicle        Equipment Recommendations Rolling walker (2 wheels);BSC/3in1 (Already delivered to room)  Recommendations for Other Services       Functional Status Assessment Patient has had a recent decline in their functional status and demonstrates the ability to make significant improvements in function in a reasonable and  predictable amount of time.     Precautions / Restrictions Precautions Precautions: Fall Restrictions Weight Bearing Restrictions Per Provider Order: No      Mobility  Bed Mobility Overal bed mobility: Needs Assistance Bed Mobility: Supine to Sit, Sit to Supine     Supine to sit: Supervision Sit to supine: Supervision   General bed mobility comments: increased time and effort    Transfers Overall transfer level: Needs assistance Equipment used: 1 person hand held assist Transfers: Sit to/from Stand Sit to Stand: Min assist (posterior lean)           General transfer comment: Constant cues for hand placement however pt still opted to push up on RW. Min A to hold RW down.    Ambulation/Gait Ambulation/Gait assistance: Contact guard assist, Min assist Gait Distance (Feet): 150 Feet Assistive device: Rolling walker (2 wheels) Gait Pattern/deviations: Drifts right/left, Staggering right, Staggering left, Decreased stride length, Step-through pattern Gait velocity: decreased     General Gait Details: Constant cues for safety, obstacle navigation, and proximity to RW.  Stairs            Wheelchair Mobility     Tilt Bed    Modified Rankin (Stroke Patients Only)       Balance Overall balance assessment: Needs assistance Sitting-balance support: No upper extremity supported, Feet supported Sitting balance-Leahy Scale: Good     Standing balance support: No upper extremity supported Standing balance-Leahy Scale: Poor Standing balance comment: posterior lean with pt wanting to reach for surfaces to gain balance                             Pertinent Vitals/Pain Pain  Assessment Pain Assessment: No/denies pain    Home Living Family/patient expects to be discharged to:: Private residence Living Arrangements: Spouse/significant other Available Help at Discharge: Family;Available 24 hours/day Type of Home: House Home Access: Stairs to  enter Entrance Stairs-Rails: None Entrance Stairs-Number of Steps: 2   Home Layout: One level Home Equipment: Gilmer Mor - single point Additional Comments: Patient is from [place], has been in the [place] for 2 years, only speaks [place].    Prior Function Prior Level of Function : Independent/Modified Independent             Mobility Comments: furniture walks at home       Extremity/Trunk Assessment   Upper Extremity Assessment Upper Extremity Assessment: LUE deficits/detail LUE Deficits / Details: left UE delayed with movement compared to right LUE Coordination: decreased gross motor;decreased fine motor    Lower Extremity Assessment Lower Extremity Assessment: Generalized weakness    Cervical / Trunk Assessment Cervical / Trunk Assessment: Normal  Communication   Communication Communication: Other (comment) (Spanish speaking)  Cognition Arousal: Alert Behavior During Therapy: WFL for tasks assessed/performed, Impulsive Overall Cognitive Status: History of cognitive impairments - at baseline                                 General Comments: perservation on tasks at times. Slightly impulsive and difficulty following commands at times.        General Comments General comments (skin integrity, edema, etc.): VSS on RA    Exercises     Assessment/Plan    PT Assessment Patient needs continued PT services  PT Problem List Decreased strength;Decreased range of motion;Decreased activity tolerance;Decreased balance;Decreased coordination;Decreased mobility;Decreased cognition;Decreased knowledge of use of DME;Decreased safety awareness;Decreased knowledge of precautions;Cardiopulmonary status limiting activity       PT Treatment Interventions DME instruction;Gait training;Stair training;Functional mobility training;Therapeutic activities;Balance training;Therapeutic exercise;Neuromuscular re-education;Cognitive remediation;Patient/family  education    PT Goals (Current goals can be found in the Care Plan section)  Acute Rehab PT Goals Patient Stated Goal: to go home PT Goal Formulation: With patient Time For Goal Achievement: 11/28/23 Potential to Achieve Goals: Fair    Frequency Min 1X/week     Co-evaluation               AM-PAC PT "6 Clicks" Mobility  Outcome Measure Help needed turning from your back to your side while in a flat bed without using bedrails?: A Little Help needed moving from lying on your back to sitting on the side of a flat bed without using bedrails?: A Little Help needed moving to and from a bed to a chair (including a wheelchair)?: A Little Help needed standing up from a chair using your arms (e.g., wheelchair or bedside chair)?: A Little Help needed to walk in hospital room?: A Little Help needed climbing 3-5 steps with a railing? : A Lot 6 Click Score: 17    End of Session Equipment Utilized During Treatment: Gait belt Activity Tolerance: Patient tolerated treatment well Patient left: in bed;with call bell/phone within reach;with bed alarm set;with family/visitor present Nurse Communication: Mobility status PT Visit Diagnosis: Other abnormalities of gait and mobility (R26.89)    Time: 1410-1430 PT Time Calculation (min) (ACUTE ONLY): 20 min   Charges:   PT Evaluation $PT Eval Moderate Complexity: 1 Mod   PT General Charges $$ ACUTE PT VISIT: 1 Visit         Randa Riss B, PT, DPT  Acute Rehab Services [phone]   Gladys Damme 11/14/2023, 4:13 PM

## 2023-11-15 ENCOUNTER — Other Ambulatory Visit (HOSPITAL_COMMUNITY): Payer: Self-pay

## 2023-11-17 ENCOUNTER — Telehealth: Payer: Self-pay

## 2023-11-17 ENCOUNTER — Other Ambulatory Visit (HOSPITAL_BASED_OUTPATIENT_CLINIC_OR_DEPARTMENT_OTHER): Payer: Self-pay

## 2023-11-17 NOTE — Transitions of Care (Post Inpatient/ED Visit) (Signed)
11/17/2023  Name: Cory Romero MRN: [id number] DOB: 09-Jun-1956  Today's TOC FU Call Status: Today's TOC FU Call Status:: Successful TOC FU Call Completed TOC FU Call Complete Date: 11/17/23 Patient's Name and Date of Birth confirmed.  Transition Care Management Follow-up Telephone Call Date of Discharge: 11/14/23 Discharge Facility: [facility]) Type of Discharge: Inpatient Admission Primary Inpatient Discharge Diagnosis:: CVA How have you been since you were released from the hospital?: Better (via translation "He is doing really well - no problems".) Any questions or concerns?: No  Items Reviewed: Did you receive and understand the discharge instructions provided?: Yes Medications obtained,verified, and reconciled?: Yes (Medications Reviewed) Any new allergies since your discharge?: No Dietary orders reviewed?: Yes Type of Diet Ordered:: Heart Healthy, thin liq Do you have support at home?: Yes People in Home: spouse  Medications Reviewed Today: Medications Reviewed Today     Reviewed by Wyline Mood, RN (Case Manager) on 11/17/23 at 1025  Med List Status: <None>   Medication Order Taking? Sig Documenting Provider Last Dose Status Informant  acetaminophen (TYLENOL) 500 MG tablet [id number] Yes Take 500 mg by mouth every 6 (six) hours as needed for moderate pain (pain score 4-6). [provider] Taking Active Self, Other  amLODipine (NORVASC) 5 MG tablet [id number] Yes Take 1 tablet (5 mg total) by mouth daily. Saguier, Ramon Dredge, PA-C Taking Active Self, Other  clopidogrel (PLAVIX) 75 MG tablet [id number] Yes Take 1 tablet (75 mg total) by mouth daily. Maretta Bees, MD Taking Active   cyanocobalamin (VITAMIN B12) 1000 MCG tablet [id number] Yes Take 1,000 mcg by mouth daily. [provider] Taking Active Self, Other  losartan (COZAAR) 50 MG tablet [id number] Yes Take 1 tablet (50 mg total) by mouth daily. Saguier, Ramon Dredge, PA-C Taking Active  Self, Other  melatonin 3 MG TABS tablet [id number] Yes Take 3 mg by mouth at bedtime. [provider] Taking Active Self, Other  rosuvastatin (CRESTOR) 40 MG tablet [id number] Yes Take 1 tablet (40 mg total) by mouth daily. Saguier, Ramon Dredge, PA-C Taking Active Self, Other            Home Care and Equipment/Supplies: Were Home Health Services Ordered?: Yes Name of Home Health Agency:: [facility] for Home therapy Has Agency set up a time to come to your home?: No EMR reviewed for Home Health Orders:  (Patient discharged Fri, 12/13.  Spouse provided phone number for [facility] if she does not hear from them in 24 hrs) Any new equipment or medical supplies ordered?: Yes Name of Medical supply agency?: [employer] [facility], bedside commode Were you able to get the equipment/medical supplies?: Yes Do you have any questions related to the use of the equipment/supplies?: No  Functional Questionnaire: Do you need assistance with bathing/showering or dressing?: No (Just someone standing by for safety/balance but able to perform self care) Do you need assistance with meal preparation?: Yes (Wife assist) Do you need assistance with eating?: No Do you have difficulty maintaining continence: No Do you need assistance with getting out of bed/getting out of a chair/moving?: No (Stand by for balance, safety when inititating) Do you have difficulty managing or taking your medications?: No (Wife assist)  Follow up appointments reviewed: PCP Follow-up appointment confirmed?: No (Wife states she will set appointment herself to arrange around her work schedule; understands appt is requested within 1st wk discharge) MD Provider Line Number:[phone] Given: No Specialist Hospital Follow-up appointment confirmed?: Yes Date of Specialist follow-up appointment?: 11/18/23 Follow-Up Specialty Provider::  Cardiology - Carolan Clines Do you need transportation to your follow-up appointment?: No Do you  understand care options if your condition(s) worsen?: Yes-patient verbalized understanding  SDOH Interventions Today    Flowsheet Row Most Recent Value  SDOH Interventions   Food Insecurity Interventions Intervention Not Indicated  Housing Interventions Intervention Not Indicated  Utilities Interventions Intervention Not Indicated    Goals Addressed             This Visit's Progress    Transition of Care       Current Barriers:  Medication management Home Health services - [facility] to provide Home Physical Terap[employer] Equipment/DME - DME provider:  Loyal Buba ; DME: Levan Hurst, Bedside Commode Functional/Safety - CVA with mild functional deficits   RNCM Clinical Goal(s):  Patient will work with the Care Management team over the next 30 days to address Transition of Care Barriers: Medication Management Support at home Provider appointments Home Health services Equipment/DME Functional/Safety through collaboration with RN Care manager, provider, and care team.   Interventions: Evaluation of current treatment plan related to  self management and patient's adherence to plan as established by provider. Utilized Paramedic from [employer] [phone]) for spanish-speaking patient.   Stroke:  (Status:New goal.) Short Term Goal Reviewed Importance of taking all medications as prescribed Reviewed Importance of attending all scheduled provider appointments and to follow-up with Neurology for appointment if you have do not receive a call from them in 1-2 weeks Reviewed referrals to home health for home physical therapy  Patient Goals/Self-Care Activities: Participate in Transition of Care Program/Attend [facility] scheduled calls Notify RN Care Manager of [facility] call rescheduling needs Take all medications as prescribed Attend all scheduled provider appointments Call provider office for new concerns or questions   Follow Up Plan:  Telephone follow up appointment  with care management team member scheduled for:  11/24/2023 at 10am The patient has been provided with contact information for the care management team and has been advised to call with any health related questions or concerns.          TOC Interventions: - Reviewed PCP and any Specialist upcoming appointments and importance of keeping scheduled appointments. -Patient verbal education was provided and verbalized understanding of:  30 Day Program, Hospital discharge summary instructions including post-discharge diet and activity orders;  fall/safety/measures;  orders for and importance of home health physical therapy, presence of discharge home medications ,  when to notify provider for questions, concerns, or changes in condition.    Language interpreter services used for the duration of this call [employer]pre[employer] - [phone].  Randon Somera Daphine Deutscher BSN, RN RN Care Manager   Transitions of Care [facility] Direct Dial Number:  [phone]

## 2023-11-18 ENCOUNTER — Encounter: Payer: Self-pay | Admitting: Internal Medicine

## 2023-11-18 ENCOUNTER — Encounter: Payer: Self-pay | Admitting: *Deleted

## 2023-11-18 ENCOUNTER — Ambulatory Visit: Payer: 59 | Attending: Internal Medicine | Admitting: Internal Medicine

## 2023-11-18 VITALS — BP 176/78 | HR 105 | Ht 66.0 in | Wt 150.4 lb

## 2023-11-18 DIAGNOSIS — I639 Cerebral infarction, unspecified: Secondary | ICD-10-CM | POA: Diagnosis not present

## 2023-11-18 NOTE — Progress Notes (Signed)
  Cardiology Office Note:  .   Date:  11/18/2023  ID:  Napoleon Form, [place] [birth year], MRN [id number] PCP: Marisue Brooklyn  [facility] Providers Cardiologist:  None    History of Present Illness: .   Abanoub Zoe Lan Smedberg is a 67 y.o. 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 TTE did not show an intracardiac source for stroke.  He had normal LV function as well as RV function and no valve disease.  He did have significant asymmetric LVH .  Basal septum was 19 mm. No LVOT obstruction, and no SAM. His ECG at that time showed sinus rhythm.   Today, he is here with an interpreter.  His blood pressure has been high in the past. It has been high since a prior stroke in 2022.  He has not started medications for blood pressure, they will start tomorrow. BP is high today 176/78 mmhg. No known prior cardiac hx. He does not have any cardiac symptoms.  ROS:  per HPI otherwise negative   Studies Reviewed: .        Per above Risk Assessment/Calculations:    Physical Exam:   VS:   Vitals:   11/18/23 1611  BP: (!) 176/78  Pulse: (!) 105  SpO2: 94%     Wt Readings from Last 3 Encounters:  11/12/23 150 lb 9.2 oz (68.3 kg)  09/24/23 150 lb 9.6 oz (68.3 kg)    GEN: Well nourished, well developed in no acute distress NECK: No JVD; No carotid bruits CARDIAC: RRR, no murmurs, rubs, gallops RESPIRATORY:  Clear to auscultation without rales, wheezing or rhonchi  ABDOMEN: Soft, non-tender, non-distended EXTREMITIES:  No edema; No deformity   ASSESSMENT AND PLAN: .   Post CVA 30 day event monitor Will be important to manage blood pressure. Continue Crestor 40 mg daily  Asymmetric LVH HTN With the significant asymmetric LVH of the basal septum will get a cardiac MRI to rule out HCM.   HTN: We discussed blood pressure goal is less than 130/80 mmHg.  Once he starts his  medications, I recommended that his wife and he monitor his blood pressures at home and let us know if they are elevated. -Prescribed Norvasc 5 mg daily and losartan 50 mg daily   Dispo: Follow-up in 6 months  Signed, Ebony Rickel, Alben Spittle, MD

## 2023-11-18 NOTE — Patient Instructions (Signed)
Medication Instructions:  None  *If you need a refill on your cardiac medications before your next appointment, please call your pharmacy*   Lab Work: None  If you have labs (blood work) drawn today and your tests are completely normal, you will receive your results only by: MyChart Message (if you have MyChart) OR A paper copy in the mail If you have any lab test that is abnormal or we need to change your treatment, we will call you to review the results.   Testing/Procedures: Cardiac MRI Your physician has requested that you have a cardiac MRI. Cardiac MRI uses a computer to create images of your heart as its beating, producing both still and moving pictures of your heart and major blood vessels. For further information please visit [email or web address]. Please follow the instruction sheet given to you today for more information.   Cardiac Event Monitor Your physician has recommended that you wear an event monitor. Event monitors are medical devices that record the heart's electrical activity. Doctors most often [place] these monitors to diagnose arrhythmias. Arrhythmias are problems with the speed or rhythm of the heartbeat. The monitor is a small, portable device. You can wear one while you do your normal daily activities. This is usually used to diagnose what is causing palpitations/syncope (passing out).    Follow-Up: At [facility], you and your health needs are our priority.  As part of our continuing mission to provide you with exceptional heart care, we have created designated Provider Care Teams.  These Care Teams include your primary Cardiologist (physician) and Advanced Practice Providers (APPs -  Physician Assistants and Nurse Practitioners) who all work together to provide you with the care you need, when you need it.  We recommend signing up for the patient portal called "MyChart".  Sign up information is provided on this After Visit Summary.  MyChart is used to connect  with patients for Virtual Visits (Telemedicine).  Patients are able to view lab/test results, encounter notes, upcoming appointments, etc.  Non-urgent messages can be sent to your provider as well.   To learn more about what you can do with MyChart, go to [email or web address].    Your next appointment:   6 month(s)  Provider:   Maisie Fus, MD

## 2023-11-18 NOTE — Progress Notes (Signed)
Patient enrolled for [employer] to ship a 30 day cardiac event monitor to his address on file. Spanish instructions requested.  Letter with instructions in English & Spanish mailed to patient.

## 2023-11-20 ENCOUNTER — Telehealth: Payer: Self-pay

## 2023-11-20 NOTE — Telephone Encounter (Signed)
Copied from CRM [id number]. Topic: Clinical - Home Health Verbal Orders >> Nov 20, 2023  3:12 PM Hector Shade B wrote: Caller/Agency: [facility] Callback Number: [phone] Service Requested: Physical Therapy Frequency: 2x's for 1 week thereafter 1x a week for 4 weeks Any new concerns about the patient? No

## 2023-11-21 NOTE — Telephone Encounter (Signed)
VO given.

## 2023-11-24 ENCOUNTER — Other Ambulatory Visit: Payer: Self-pay

## 2023-11-24 DIAGNOSIS — I639 Cerebral infarction, unspecified: Secondary | ICD-10-CM

## 2023-11-24 NOTE — Patient Outreach (Signed)
Care Management  Transitions of Care Program Transitions of Care Post-discharge week 3   11/24/2023 Name: Cory Romero MRN: [id number] DOB: 06/15/1956  Subjective: Cory Romero is a 67 y.o. year old male who is a primary care patient of Saguier, Ramon Dredge, [place]. The Care Management team engaged with patient telephone to assess and address transitions of care needs.   Consent to Services:  Patient was given information about care management services, agreed to services, and gave verbal consent to participate.   Assessment:    TOC Follow Up call completed using a [employer] - [phone], six digit code [phone].  RNCM spoke with patient's family member, Cory Romero, daughter-in-law concerning Cory Romero progress and recovery.  Cory Romero states that the patient is doing better each week.  She states that he has started receiving home health therapy services and is participating well.  She reports that the patient's blood pressure has been elevated and recent doctor appointments as he has not yet started his blood pressure medications.  Advised by Cardiology that his blood pressure goal is to be less than 130/80.  Patient and family member given verbal instructions to monitor his blood pressures at home, write the readings down on a log or in a tablet and to take with them to future doctor appointments.  All verbalized understanding.  Patient enrolled for [employer] to ship a 30 day cardiac event monitor to his address on file.  Spanish and [place] instructions were requested to be sent with the monitor.         SDOH Interventions    Flowsheet Row Telephone from 11/17/2023 in [place] POPULATION HEALTH DEPARTMENT  SDOH Interventions   Food Insecurity Interventions Intervention Not Indicated  Housing Interventions Intervention Not Indicated  Utilities Interventions Intervention Not Indicated        Goals Addressed             This  Visit's Progress    Transition of Care       Current Barriers:  Medication management Home Health therapy services - [facility]  Equipment/DME - DME provider:  [employer] ; DME: Rolling Walker, Bedside Commode Functional/Safety - CVA with  functional deficits   RNCM Clinical Goal(s):  Patient will work with the Care Management team over the next 30 days to address Transition of Care Barriers: Medication Management Support at home - Family share in assisting with home self-management and Stroke recovery.  Includes spouse, daughter in law and son. Provider appointments Safe utilization of Equipment/DME with equipment training provided by home health therapy team. Functional/Safety through collaboration with RN Care manager, provider, home health agency and care team.   Interventions: Evaluation of current treatment plan related to  self management and patient's adherence to plan as established by provider. Utilized Paramedic from [employer] [phone]) for spanish-speaking patient.   Confirmed home health services have been initiated with [facility] for weekly home therapy services.  Stroke:  (Status:Goal on track:  Yes.) Short Term Goal Reviewed/discussed home monitoring of blood pressure due to recent elevated blood pressure readings in doctor's office and initiation/changes in blood pressure medicine.  Patient instruction provided on calling health insurance plan to see if there is a benefit available that would cover the cost of the blood pressure cuff.  Caregiver verbalized understanding and would assist patient with checking his benefits. Provided education on the importance of keeping a log of blood pressure readings to take with him to his doctor appointments.  Instructed that this allows the doctor to see how his blood pressure responds to his medications and/or medication changes over time and be able to better adjust his medications as  needed. Reviewed/discussed with patient and family member that Cardiology has advised that his blood pressure goal is to be less than 130/80. Reviewed importance of attending all scheduled provider appointments:  Patient has a scheduled Neurology appointment on 12/24/2023. Continue to actively engage and participate in weekly home health therapy services. Reviewed/discussed recent orders from Cardiology for placing a 30 day heart monitor and should be received in the mail within 3-5 business days from date of order (11/18/23).  Advised that the heart monitor will come with instructions in Spanish and [place].  RNCM will reassess ongoing educational needs.  Verbalized understanding.  Patient Goals/Self-Care Activities: Participate in Transition of Care Program/Attend [facility] scheduled calls Notify RN Care Manager of TOC call rescheduling needs Take all medications as prescribed Attend all scheduled provider appointments Call provider office for new concerns or questions   Follow Up Plan:  Telephone follow up appointment with care management team member scheduled for:  12/01/2023 at 3 PM The patient has been provided with contact information for the care management team and has been advised to call with any health related questions or concerns.            Plan: Telephone follow up appointment with care management team member scheduled for:  12/01/23 at 3 PM, per patient request so that his wife may be present for the telephone call. The patient has been provided with contact information for the care management team and has been advised to call with any health related questions or concerns.   Alanzo Lamb Daphine Deutscher BSN, RN RN Care Manager   Transitions of Care [facility] - [facility] Direct Dial Number:  [phone]

## 2023-11-24 NOTE — Patient Instructions (Signed)
Visit Information  Thank you for taking time to visit with me today. Please don't hesitate to contact me if I can be of assistance to you before our next scheduled telephone appointment.  Our next appointment is by telephone on 12/01/23 at 3 PM/  Following is a copy of your care plan:   Goals Addressed             This Visit's Progress    Transition of Care       Current Barriers:  Medication management Home Health therapy services - [facility]  Equipment/DME - DME provider:  [employer] ; DME: Rolling Walker, Bedside Commode Functional/Safety - CVA with  functional deficits   RNCM Clinical Goal(s):  Patient will work with the Care Management team over the next 30 days to address Transition of Care Barriers: Medication Management Support at home - Family share in assisting with home self-management and Stroke recovery.  Includes spouse, daughter in law and son. Provider appointments Safe utilization of Equipment/DME with equipment training provided by home health therapy team. Functional/Safety through collaboration with RN Care manager, provider, home health agency and care team.   Interventions: Evaluation of current treatment plan related to  self management and patient's adherence to plan as established by provider. Utilized Paramedic from [employer] [phone]) for spanish-speaking patient.   Confirmed home health services have been initiated with [facility] for weekly home therapy services.  Stroke:  (Status:Goal on track:  Yes.) Short Term Goal Reviewed/discussed home monitoring of blood pressure due to recent elevated blood pressure readings in doctor's office and initiation/changes in blood pressure medicine.  Patient instruction provided on calling health insurance plan to see if there is a benefit available that would cover the cost of the blood pressure cuff.  Caregiver verbalized understanding and would assist patient  with checking his benefits. Provided education on the importance of keeping a log of blood pressure readings to take with him to his doctor appointments.  Instructed that this allows the doctor to see how his blood pressure responds to his medications and/or medication changes over time and be able to better adjust his medications as needed.  Reviewed/discussed with patient and family member that Cardiology has advised that his blood pressure goal is to be less than 130/80. Reviewed importance of attending all scheduled provider appointments:  Patient has a scheduled Neurology appointment on 12/24/2023. Continue to actively engage and participate in weekly home health therapy services. Reviewed/discussed recent orders from Cardiology for placing a 30 day heart monitor and should be received in the mail within 3-5 business days from date of order (11/18/23).  Advised that the heart monitor will come with instructions in Spanish and [place].  RNCM will reassess ongoing educational needs.  Verbalized understanding.  Patient Goals/Self-Care Activities: Participate in Transition of Care Program/Attend [facility] scheduled calls Notify RN Care Manager of TOC call rescheduling needs Take all medications as prescribed Attend all scheduled provider appointments Call provider office for new concerns or questions   Follow Up Plan:  Telephone follow up appointment with care management team member scheduled for:  12/01/2023 at 3 PM The patient has been provided with contact information for the care management team and has been advised to call with any health related questions or concerns.            The patient verbalized understanding of instructions, educational materials, and care plan provided today and DECLINED offer to receive copy of patient instructions, educational  materials, and care plan.   Telephone follow up appointment with care management team member scheduled for:  12/01/2023 at 3 PM. The  patient has been provided with contact information for the care management team and has been advised to call with any health related questions or concerns.   Please call the care guide team at [phone] if you need to cancel or reschedule your appointment.   Please call the Suicide and Crisis Lifeline: 988 call the [place] National Suicide Prevention Lifeline: 816 844 6833 or TTY: 915-193-0594 TTY 747-025-6773) to talk to a trained counselor if you are experiencing a Mental Health or Behavioral Health Crisis or need someone to talk to.  Saraann Enneking Daphine Deutscher BSN, RN RN Care Manager   Transitions of Care [facility] - [facility] Direct Dial Number:  [phone]

## 2023-12-01 ENCOUNTER — Other Ambulatory Visit: Payer: Self-pay

## 2023-12-01 NOTE — Patient Outreach (Signed)
  Care Management  Transitions of Care Program Transitions of Care Post-discharge week 3  12/01/2023 Name: Cory Romero MRN: [id number] DOB: [birth year]  Subjective: Cory Romero is a 67 y.o. year old male who is a primary care patient of Saguier, Ramon Dredge, [place]. The Care Management team was unable to reach the patient by phone to assess and address transitions of care needs.  Scheduled TOC call attempt made via [employer], code [phone].  Plan: Additional outreach attempts will be made to reach the patient enrolled in the [facility] Program (Post Inpatient/ED Visit).  Muskan Bolla Daphine Deutscher BSN, RN RN Care Manager   Transitions of Care [facility] - [facility] Direct Dial Number:  [phone]

## 2023-12-09 ENCOUNTER — Other Ambulatory Visit: Payer: Self-pay

## 2023-12-09 NOTE — Patient Outreach (Signed)
  Care Management  Transitions of Care Program Transitions of Care Post-discharge week 4  12/09/2023 Name: Cory Romero MRN: [id number] DOB: 16-Jul-1956  Subjective: Cory Romero is a 68 y.o. year old male who is a primary care patient of Saguier, [place], PA-C. The Care Management team was unable to reach the patient by phone to assess and address transitions of care needs.   Telephone call placed using [employer] - [phone],  call code # [phone].  Daughter-in-law states the patient is not available because she has the phone he uses at work with her.    Plan: Additional outreach attempts will be made to reach the patient enrolled in the [facility] Program (Post Inpatient/ED Visit).  [name] BSN, Programmer, Systems / Transitions of Care [place] [facility] Direct Dial Number:  [phone]

## 2023-12-10 ENCOUNTER — Ambulatory Visit: Payer: 59 | Attending: Internal Medicine

## 2023-12-10 DIAGNOSIS — I639 Cerebral infarction, unspecified: Secondary | ICD-10-CM

## 2023-12-11 ENCOUNTER — Other Ambulatory Visit (HOSPITAL_BASED_OUTPATIENT_CLINIC_OR_DEPARTMENT_OTHER): Payer: Self-pay

## 2023-12-18 ENCOUNTER — Encounter (HOSPITAL_COMMUNITY): Payer: Self-pay | Admitting: Internal Medicine

## 2023-12-24 ENCOUNTER — Inpatient Hospital Stay: Payer: 59 | Admitting: Adult Health

## 2024-01-19 ENCOUNTER — Other Ambulatory Visit (HOSPITAL_COMMUNITY): Payer: Self-pay

## 2024-01-20 ENCOUNTER — Other Ambulatory Visit (HOSPITAL_BASED_OUTPATIENT_CLINIC_OR_DEPARTMENT_OTHER): Payer: Self-pay

## 2024-01-21 ENCOUNTER — Other Ambulatory Visit (HOSPITAL_COMMUNITY): Payer: Self-pay

## 2024-01-28 ENCOUNTER — Ambulatory Visit (HOSPITAL_COMMUNITY): Admission: RE | Admit: 2024-01-28 | Payer: 59 | Source: Ambulatory Visit

## 2024-03-09 ENCOUNTER — Ambulatory Visit (HOSPITAL_COMMUNITY): Payer: Self-pay | Attending: Internal Medicine

## 2024-03-12 ENCOUNTER — Other Ambulatory Visit: Payer: Self-pay

## 2024-03-12 ENCOUNTER — Other Ambulatory Visit (HOSPITAL_BASED_OUTPATIENT_CLINIC_OR_DEPARTMENT_OTHER): Payer: Self-pay

## 2024-03-16 ENCOUNTER — Other Ambulatory Visit (HOSPITAL_BASED_OUTPATIENT_CLINIC_OR_DEPARTMENT_OTHER): Payer: Self-pay

## 2024-03-17 ENCOUNTER — Other Ambulatory Visit (HOSPITAL_BASED_OUTPATIENT_CLINIC_OR_DEPARTMENT_OTHER): Payer: Self-pay

## 2024-04-23 ENCOUNTER — Other Ambulatory Visit (HOSPITAL_BASED_OUTPATIENT_CLINIC_OR_DEPARTMENT_OTHER): Payer: Self-pay

## 2024-04-27 ENCOUNTER — Other Ambulatory Visit (HOSPITAL_COMMUNITY): Payer: Self-pay

## 2024-05-10 ENCOUNTER — Ambulatory Visit: Payer: 59 | Admitting: Internal Medicine

## 2024-05-17 ENCOUNTER — Telehealth (HOSPITAL_COMMUNITY): Payer: Self-pay | Admitting: Emergency Medicine

## 2024-05-17 NOTE — Telephone Encounter (Signed)
 Used [employer] to speak to patient and wife who said they cannot make it to tomorrows appt due to the wife having to work all day.   Will inform schedulers to reach out to r/s   Jinger Mount RN Navigator Cardiac Imaging [facility] [phone] Office  [phone] Cell

## 2024-05-18 ENCOUNTER — Ambulatory Visit (HOSPITAL_COMMUNITY): Payer: Self-pay

## 2024-06-11 ENCOUNTER — Other Ambulatory Visit (HOSPITAL_BASED_OUTPATIENT_CLINIC_OR_DEPARTMENT_OTHER): Payer: Self-pay

## 2024-07-21 ENCOUNTER — Ambulatory Visit (HOSPITAL_COMMUNITY): Admission: RE | Admit: 2024-07-21 | Payer: Self-pay | Source: Ambulatory Visit

## 2024-09-07 ENCOUNTER — Other Ambulatory Visit (HOSPITAL_BASED_OUTPATIENT_CLINIC_OR_DEPARTMENT_OTHER): Payer: Self-pay

## 2024-09-07 ENCOUNTER — Other Ambulatory Visit: Payer: Self-pay

## 2025-01-07 ENCOUNTER — Other Ambulatory Visit (HOSPITAL_BASED_OUTPATIENT_CLINIC_OR_DEPARTMENT_OTHER): Payer: Self-pay

## 2025-01-07 ENCOUNTER — Other Ambulatory Visit: Payer: Self-pay | Admitting: Medical
